# Patient Record
Sex: Female | Born: 1985 | Race: Black or African American | Hispanic: No | Marital: Single | Smoking: Former smoker
Health system: Southern US, Community
[De-identification: ages and names within clinical notes are randomized; demographics above are authoritative.]

## PROBLEM LIST (undated history)

## (undated) DIAGNOSIS — E559 Vitamin D deficiency, unspecified: Secondary | ICD-10-CM

## (undated) HISTORY — PX: LEG SURGERY: SHX1003

---
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
## 2007-09-28 ENCOUNTER — Ambulatory Visit (HOSPITAL_COMMUNITY): Admission: RE | Admit: 2007-09-28 | Discharge: 2007-09-28 | Payer: Self-pay | Admitting: Obstetrics & Gynecology

## 2008-01-09 ENCOUNTER — Ambulatory Visit (HOSPITAL_COMMUNITY): Admission: RE | Admit: 2008-01-09 | Discharge: 2008-01-09 | Payer: Self-pay | Admitting: Obstetrics & Gynecology

## 2008-02-21 ENCOUNTER — Inpatient Hospital Stay (HOSPITAL_COMMUNITY): Admission: AD | Admit: 2008-02-21 | Discharge: 2008-02-23 | Payer: Self-pay | Admitting: Obstetrics

## 2009-11-01 IMAGING — US US OB DETAIL+14 WK
1 of 2 series · 14 of 28 positions shown · non-contrast
Comparison: none

OBSTETRICAL ULTRASOUND:
 This ultrasound exam was performed in the [HOSPITAL] Ultrasound Department.  The OB US report was generated in the AS system, and faxed to the ordering physician.  This report is also available in [REDACTED] PACS.

[Series 1: us ob detail +14 wk · 14 of 96 slices shown]
[im 1/96]
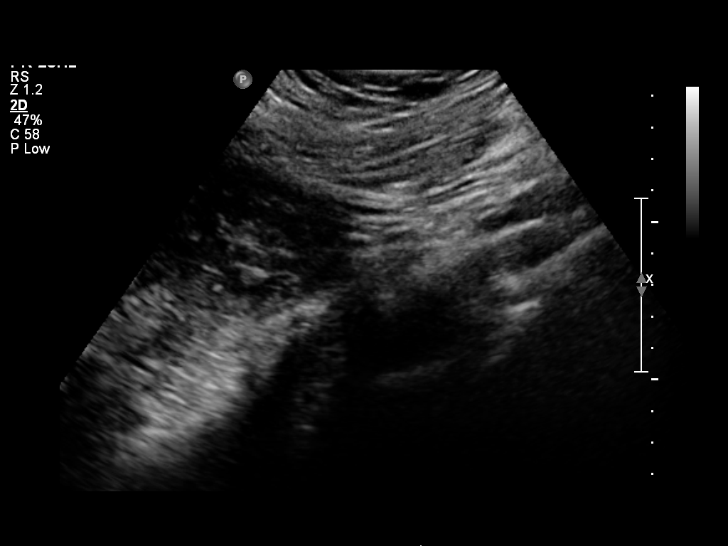
[im 8/96]
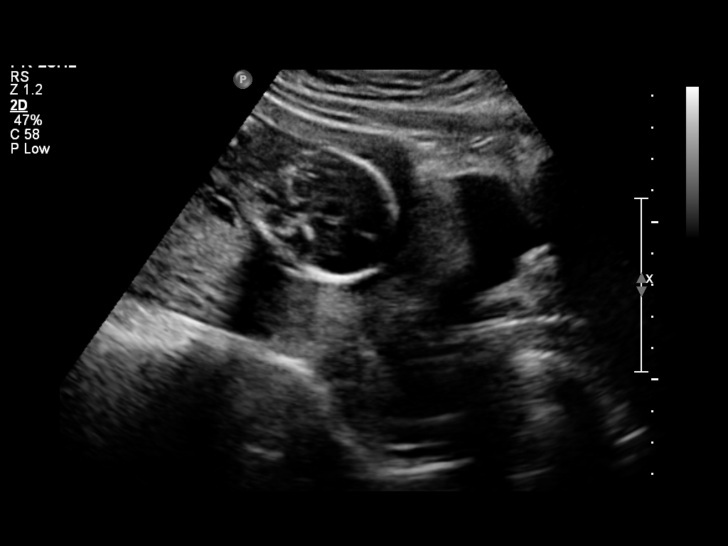
[im 15/96]
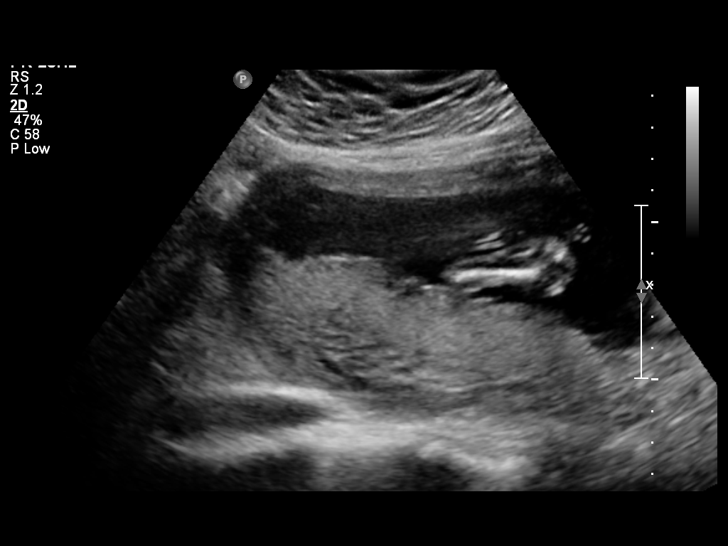
[im 22/96]
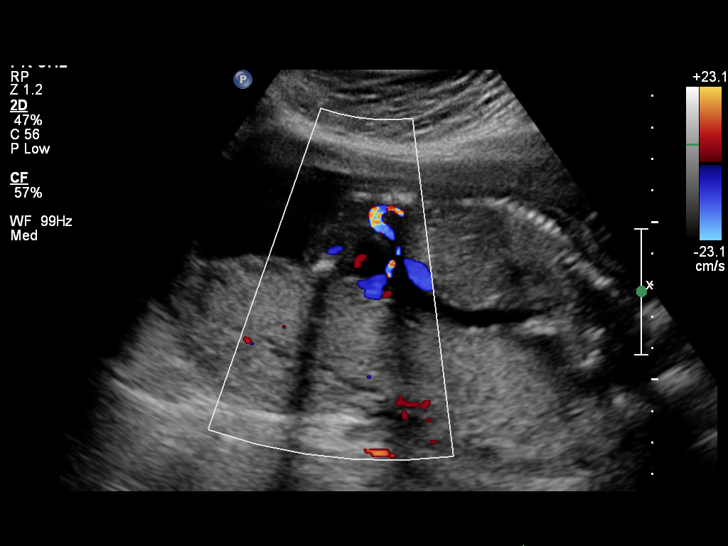
[im 30/96]
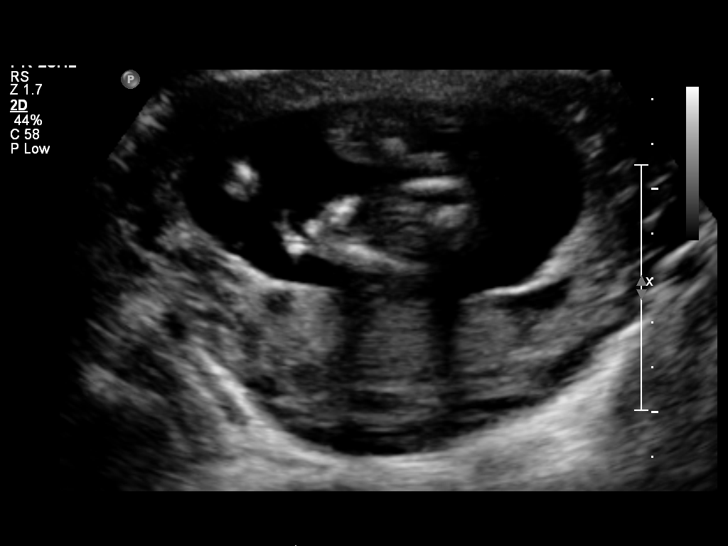
[im 37/96]
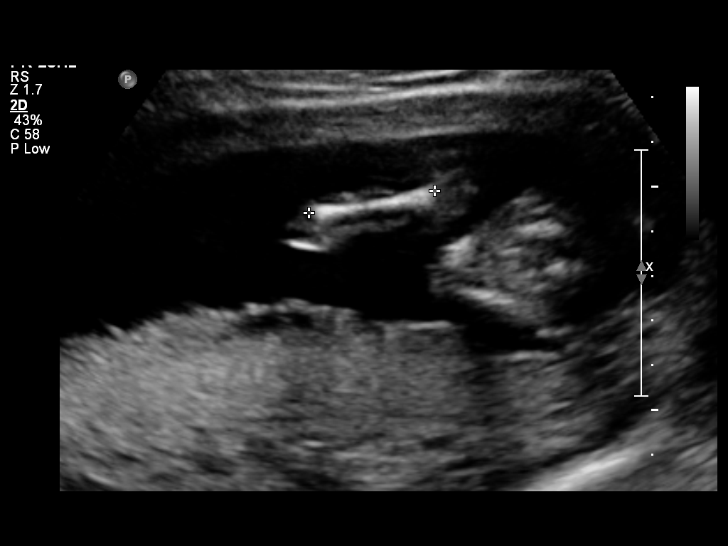
[im 44/96]
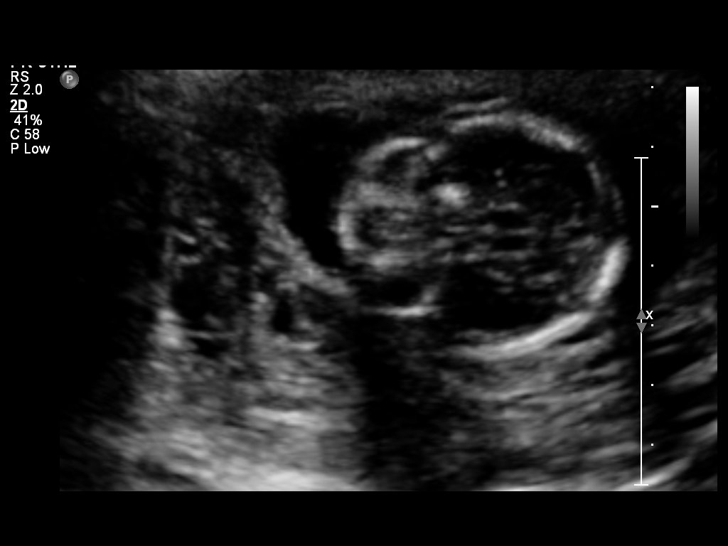
[im 52/96]
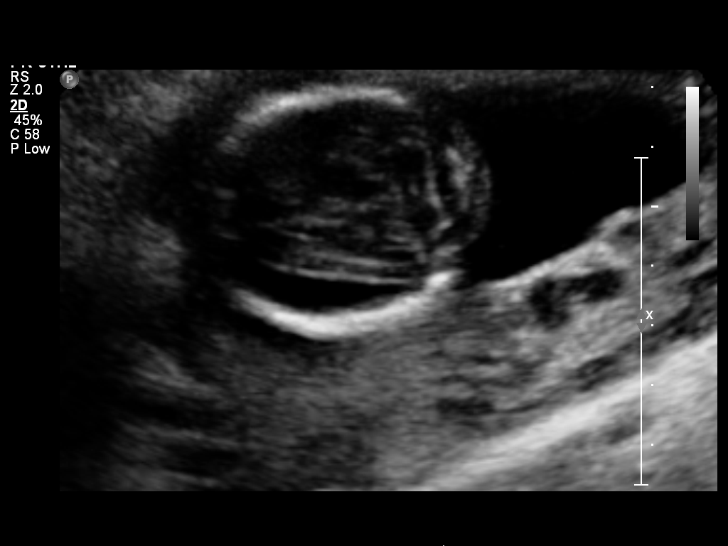
[im 59/96]
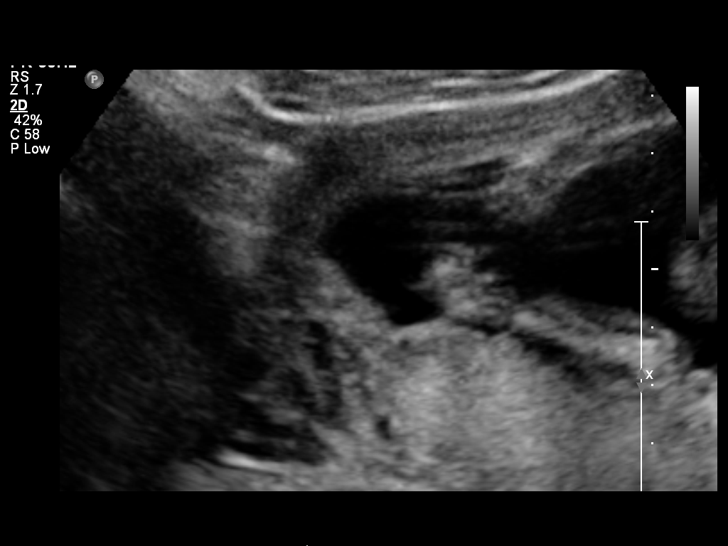
[im 66/96]
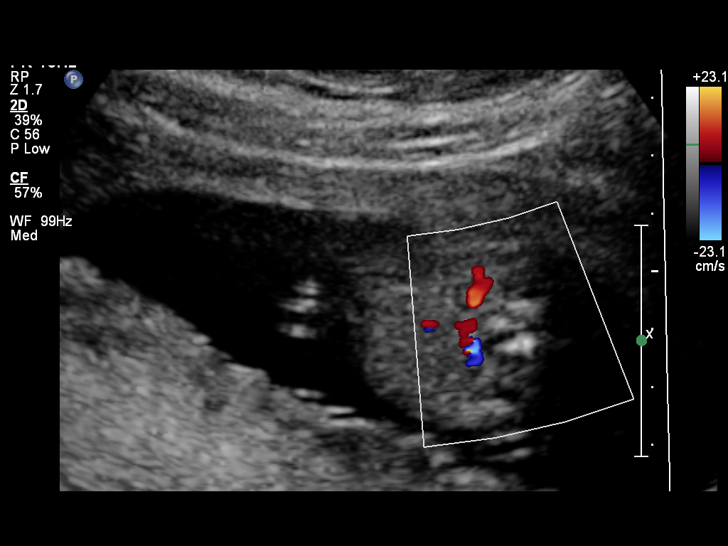
[im 74/96]
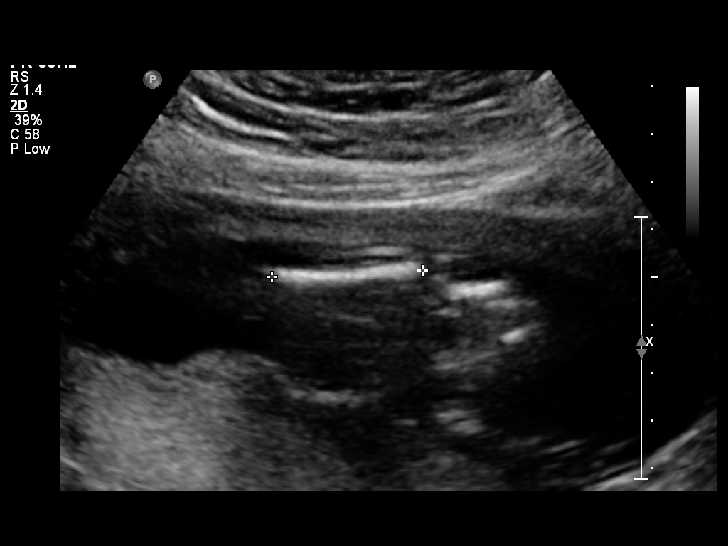
[im 81/96]
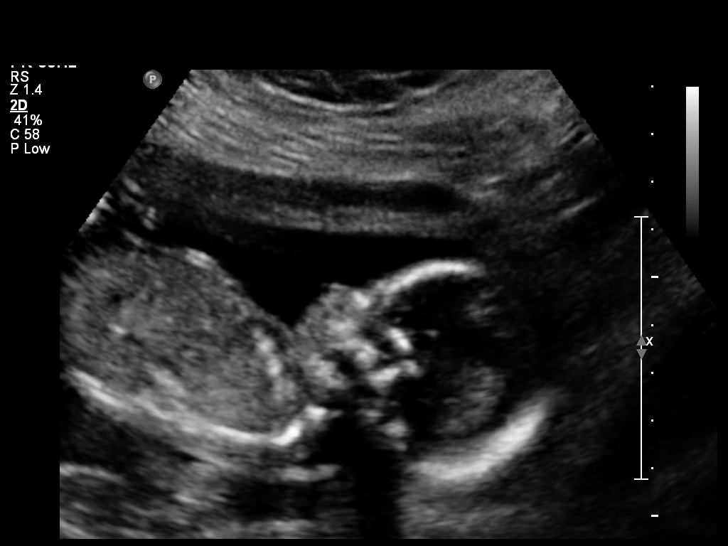
[im 88/96]
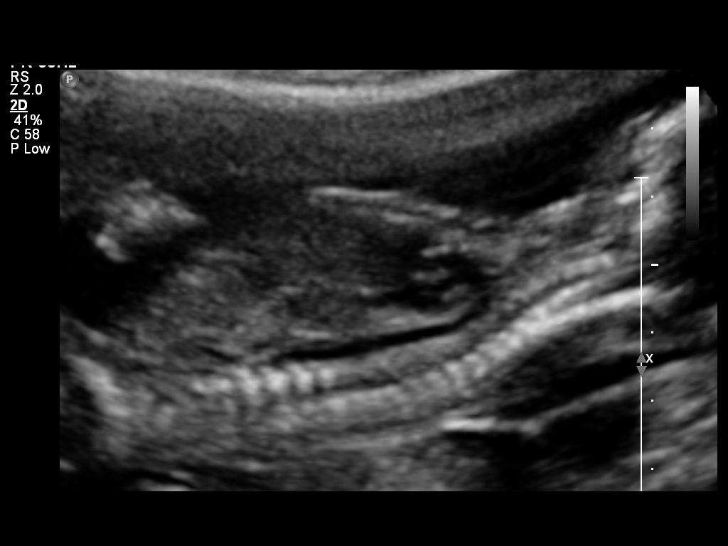
[im 96/96]
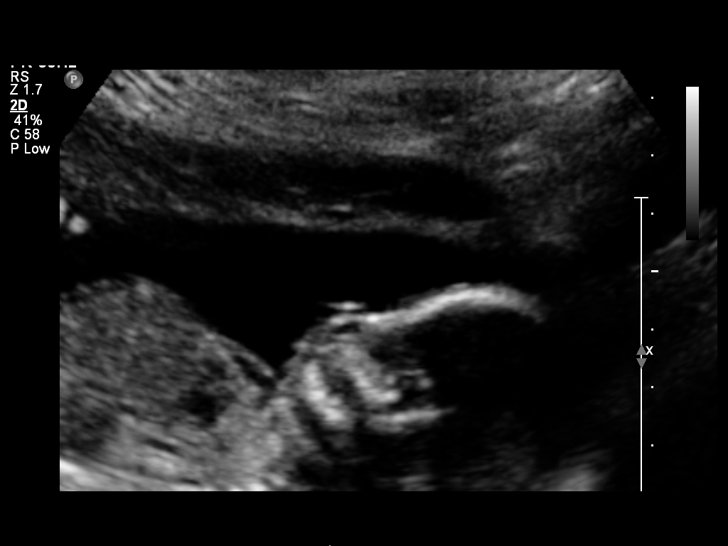

[14 of 28 positions shown; findings below may reference images not displayed]

IMPRESSION: See AS Obstetric US report.

## 2010-07-21 NOTE — H&P (Signed)
NAMEDECEMBER, [name]              ACCOUNT NO.:  [id number]   MEDICAL RECORD NO.:  [id number]          PATIENT TYPE:  INP   LOCATION:  9137                          FACILITY:  [facility]   PHYSICIAN:  Roseanna Rainbow, M.D.DATE OF BIRTH:  01/23/1986   DATE OF ADMISSION:  02/21/2008  DATE OF DISCHARGE:                              HISTORY & PHYSICAL   CHIEF COMPLAINT:  The patient is a 25 year old para 1 with an estimated  date of confinement of [name] 22, 2009, with an intrauterine pregnancy  at 39 weeks for an elective induction of labor.   HISTORY OF PRESENT ILLNESS:  Please see the above.   ALLERGIES:  No known drug allergies.   MEDICATIONS:  Prenatal vitamins.   PRENATAL LABS:  Blood type A positive, antibody screen negative, and  Chlamydia probe negative.  Urine culture and sensitivity in May 2009,  greater than 100,000 colonies/mL of E-coli.  1-hour GCT 85.  GC probe  negative.  Pap smear negative.  Affirm positive for Gardnerella  vaginalis.  Hepatitis B surface antigen negative.  Hematocrit 34.7,  hemoglobin 11.7, and platelets 262,000.  RPR nonreactive.  Rubella  immune.  Sickle cell negative.  Varicella immune.  GBS positive on  [name] 2.   PAST OBSTETRICAL HISTORY:  In April 2007 she has delivered of a live  born female at 41 weeks, 7 pounds 10 ounces vaginal delivery.  No  complications.   PAST GYN HISTORY:  Noncontributory.   PAST MEDICAL HISTORY:  No significant history of medical diseases.   PAST SURGICAL HISTORY:  She denies.   SOCIAL HISTORY:  She is a Consulting civil engineer.  She is also employed as a [employer] Services operational officer.  She is single living with her significant other.  She does not give any  significant history of alcohol usage.  She has no significant smoking  history.  She denies illicit drug use.   FAMILY HISTORY:  Remarkable for adult-onset diabetes and hypertension.   PHYSICAL EXAMINATION:  VITAL SIGNS:  Stable, afebrile.  Fetal heart tracing reassuring.   Tocodynamometer, regular uterine  contractions.  Sterile vaginal exam per the RN.   ASSESSMENT:  Primipara at term for an elective induction of labor.  Unfavorable Bishop score.  Fetal heart tracing consistent with fetal  well-being.  GBS positive.   PLAN:  Admission to stage induction of labor.  Penicillin GBS  prophylaxis.      Roseanna Rainbow, M.D.  Electronically Signed     LAJ/MEDQ  D:  02/22/2008  T:  02/22/2008  Job:  [id number]

## 2010-07-21 NOTE — H&P (Signed)
[name], [name]              ACCOUNT NO.:  [id number]   MEDICAL RECORD NO.:  [id number]          PATIENT TYPE:  INP   LOCATION:  9137                          [facility]ACILITY:  [facility]   PHYSICIAN:  Roseanna Rainbow, M.D.DATE OF BIRTH:  Jan 08, 1986   DATE OF ADMISSION:  02/21/2008  DATE OF DISCHARGE:                              HISTORY & PHYSICAL   CHIEF COMPLAINT:  The patient is a 25 year old, para 1, with an  estimated date of confinement of February 27, 2008 with an intrauterine  pregnancy at 39 weeks for an elective induction of labor.   HISTORY OF PRESENT ILLNESS:  Please see the above.   ALLERGIES:  No known drug allergies.   MEDICATIONS:  Please see the medication reconciliation form.   PRENATAL LABS:  Blood type A  positive.  Chlamydia probe negative.  GC  probe negative.  Urine culture and sensitivity in May 2009, greater than  100,000, colonies per mL E. Coli.      Roseanna Rainbow, M.D.  Electronically Signed     LAJ/MEDQ  D:  02/22/2008  T:  02/22/2008  Job:  [id number]

## 2010-07-24 NOTE — Discharge Summary (Signed)
NAMEPARISHA, [name]              ACCOUNT NO.:  [id number]   MEDICAL RECORD NO.:  [id number]          PATIENT TYPE:  INP   LOCATION:  9309                          [facility]ACILITY:  [facility]   PHYSICIAN:  Charles A. Clearance Coots, M.D.DATE OF BIRTH:  01-27-1986   DATE OF ADMISSION:  06/19/2005  DATE OF DISCHARGE:  06/23/2005                                 DISCHARGE SUMMARY   ADMITTING DIAGNOSIS:  Postpartum preeclampsia   DISCHARGE DIAGNOSIS:  Postpartum preeclampsia, improved after tocolysis and  bedrest.   Discharged home much improved   REASON FOR ADMISSION:  An 25 year old G1 P1 status post normal spontaneous  vaginal delivery on 06/16/2005 presented to urgent medical care with  complaint of ear ache.  Blood pressure on examination was 150/99.  The  patient was sent to [facility], [place], for increased blood  pressure.  She denied headache or visual changes.  Previous vaginal delivery  had been uncomplicated.   PAST MEDICAL HISTORY:  SURGERY: None.  ILLNESSES: None.   MEDICATIONS:  Antibiotic for ear infection, prenatal vitamins.   ALLERGIES:  NO KNOWN DRUG ALLERGIES.   SOCIAL HISTORY:  Single.  Negative tobacco, alcohol or recreational drug  use.   PHYSICAL EXAMINATION:  She is afebrile.  Blood pressure was 68/107.  LUNGS: Clear to auscultation bilaterally.  HEART: Regular rate and rhythm.  ABDOMEN: Gravid, nontender.  PELVIC: Exam omitted.   ADMITTING LABORATORY VALUES:  Hemoglobin 12.2, hematocrit 36, white blood  cell count 7600, platelets 227,000.  Comprehensive metabolic panel was  remarkable for a AST of 42.  Urinalysis remarkable for specific gravity  1.030, large hemoglobin, greater than 300 protein, few epithelials.   HOSPITAL COURSE:  The patient was admitted and started on IV magnesium  sulfate for seizure prophylaxis.  She continued to have elevated blood  pressures until hospital day #3 when her blood pressures normalized.   The remainder of the hospital  course was uncomplicated, and she was  discharged home on postpartum day #7 on labetalol and hydrochlorothiazide  p.o. in good condition.   DISCHARGE DISPOSITION:   MEDICATIONS:  1.  Labetalol 200 mg p.o. b.i.d.  2.  Hydrochlorothiazide 25 mg p.o. daily.  3.  Continue prenatal vitamins.   Routine written instructions were given for blood pressure precautions.  The  patient is to call office for a follow-up appointment in 48 hours for blood  pressure check.      Charles A. Clearance Coots, M.D.  Electronically Signed     CAH/MEDQ  D:  08/26/2005  T:  08/26/2005  Job:  [id number]

## 2010-07-27 ENCOUNTER — Emergency Department (HOSPITAL_COMMUNITY): Payer: BC Managed Care – PPO

## 2010-07-27 ENCOUNTER — Emergency Department (HOSPITAL_COMMUNITY)
Admission: EM | Admit: 2010-07-27 | Discharge: 2010-07-28 | Disposition: A | Discharge status: Home / Self Care | Payer: BC Managed Care – PPO | Attending: Emergency Medicine | Admitting: Emergency Medicine

## 2010-07-27 DIAGNOSIS — S82899A Other fracture of unspecified lower leg, initial encounter for closed fracture: Secondary | ICD-10-CM | POA: Insufficient documentation

## 2010-07-27 DIAGNOSIS — X500XXA Overexertion from strenuous movement or load, initial encounter: Secondary | ICD-10-CM | POA: Insufficient documentation

## 2010-07-27 DIAGNOSIS — Y93A3 Activity, aerobic and step exercise: Secondary | ICD-10-CM | POA: Insufficient documentation

## 2010-07-27 DIAGNOSIS — M25579 Pain in unspecified ankle and joints of unspecified foot: Secondary | ICD-10-CM | POA: Insufficient documentation

## 2010-08-05 ENCOUNTER — Emergency Department (HOSPITAL_BASED_OUTPATIENT_CLINIC_OR_DEPARTMENT_OTHER)
Admission: EM | Admit: 2010-08-05 | Discharge: 2010-08-05 | Disposition: A | Discharge status: Home / Self Care | Payer: BC Managed Care – PPO | Attending: Emergency Medicine | Admitting: Emergency Medicine

## 2010-08-05 DIAGNOSIS — K5900 Constipation, unspecified: Secondary | ICD-10-CM | POA: Insufficient documentation

## 2010-08-05 DIAGNOSIS — K5641 Fecal impaction: Secondary | ICD-10-CM | POA: Insufficient documentation

## 2010-12-11 LAB — CBC
HCT: 34.9 % — ABNORMAL LOW (ref 36.0–46.0)
HCT: 36.7 % (ref 36.0–46.0)
Hemoglobin: 11.4 g/dL — ABNORMAL LOW (ref 12.0–15.0)
Hemoglobin: 12.2 g/dL (ref 12.0–15.0)
MCHC: 32.7 g/dL (ref 30.0–36.0)
MCHC: 33.3 g/dL (ref 30.0–36.0)
MCV: 84.1 fL (ref 78.0–100.0)
MCV: 84.9 fL (ref 78.0–100.0)
Platelets: 216 10*3/uL (ref 150–400)
Platelets: 242 10*3/uL (ref 150–400)
RBC: 4.11 MIL/uL (ref 3.87–5.11)
RBC: 4.36 MIL/uL (ref 3.87–5.11)
RDW: 13.5 % (ref 11.5–15.5)
RDW: 14.1 % (ref 11.5–15.5)
WBC: 7.5 10*3/uL (ref 4.0–10.5)
WBC: 8.8 10*3/uL (ref 4.0–10.5)

## 2010-12-11 LAB — RPR: RPR Ser Ql: NONREACTIVE

## 2011-01-08 ENCOUNTER — Ambulatory Visit: Payer: BC Managed Care – PPO | Admitting: Family Medicine

## 2013-10-02 ENCOUNTER — Ambulatory Visit: Payer: Self-pay | Admitting: Advanced Practice Midwife

## 2013-10-31 ENCOUNTER — Ambulatory Visit: Payer: Self-pay | Admitting: Obstetrics & Gynecology

## 2014-03-04 ENCOUNTER — Encounter: Payer: Self-pay | Admitting: *Deleted

## 2014-03-05 ENCOUNTER — Encounter: Payer: Self-pay | Admitting: Obstetrics & Gynecology

## 2014-05-30 ENCOUNTER — Telehealth: Payer: Self-pay | Admitting: *Deleted

## 2014-05-30 NOTE — Telephone Encounter (Signed)
Patient is interested in an appointment for birth control.  Attempted to contact the patient and left message for patient to contact the office.

## 2014-06-21 NOTE — Telephone Encounter (Signed)
Patient returned call to the office. Patient currently has a Mirena IUD that is due to be removed. Patient would like to have a new one in it's place. Patient has been scheduled for 06/25/14 @ 10 am.

## 2014-06-25 ENCOUNTER — Ambulatory Visit (INDEPENDENT_AMBULATORY_CARE_PROVIDER_SITE_OTHER): Payer: BLUE CROSS/BLUE SHIELD | Admitting: Certified Nurse Midwife

## 2014-06-25 ENCOUNTER — Encounter: Payer: Self-pay | Admitting: Certified Nurse Midwife

## 2014-06-25 VITALS — BP 118/75 | HR 68 | Temp 98.5°F | Wt 252.0 lb

## 2014-06-25 DIAGNOSIS — Z01812 Encounter for preprocedural laboratory examination: Secondary | ICD-10-CM | POA: Diagnosis not present

## 2014-06-25 DIAGNOSIS — Z30430 Encounter for insertion of intrauterine contraceptive device: Secondary | ICD-10-CM | POA: Diagnosis not present

## 2014-06-25 LAB — POCT URINE PREGNANCY: Preg Test, Ur: NEGATIVE

## 2014-06-25 NOTE — Progress Notes (Signed)
Patient ID: [name], female   DOB: 11/07/1985, 29 y.o.   MRN: [id number]  IUD Procedure Note   DIAGNOSIS: Desires to continue long-term, reversible contraception   PROCEDURE: IUD placement Performing Provider: Orvilla Cornwallachelle Chelsey Redondo CNM  Patient counseled prior to procedure. I explained risks and benefits of Mirena IUD, reviewed alternative forms of contraception. Patient stated understanding and consented to continue with procedure.   LMP: unknown, not having regular cycles with Mirena IUD Pregnancy Test: Negative Lot #: [id number] Expiration Date: 11/18   IUD type: [X]  Mirena   PROCEDURE:  Timeout procedure was performed to ensure right patient and right site.  A bimanual exam was performed to determine the position of the uterus, midline, IUD strings present.The speculum was placed.  IUD removed intact without difficulty. The vagina and cervix was sterilized in the usual manner and sterile technique was maintained throughout the course of the procedure. A single toothed tenaculum was not needed. The depth of the uterus was sounded to 6.5cm. The IUD was inserted to the appropriate depth and inserted without difficulty.  The string was cut to an estimated 4 cm length. Bleeding was minimal. The patient tolerated the procedure well.   Follow up: The patient tolerated the procedure well without complications.  Standard post-procedure care is explained and return precautions are given.  F/U in 1 month for string check.  Patient educated to take Ibuprofen for pain.   Orvilla Cornwallachelle Lauriana Denes CNM

## 2014-06-28 LAB — SURESWAB, VAGINOSIS/VAGINITIS PLUS
Atopobium vaginae: NOT DETECTED Log (cells/mL)
C. GLABRATA, DNA: NOT DETECTED
C. albicans, DNA: NOT DETECTED
C. parapsilosis, DNA: NOT DETECTED
C. trachomatis RNA, TMA: NOT DETECTED
C. tropicalis, DNA: NOT DETECTED
Gardnerella vaginalis: NOT DETECTED Log (cells/mL)
LACTOBACILLUS SPECIES: NOT DETECTED Log (cells/mL)
MEGASPHAERA SPECIES: NOT DETECTED Log (cells/mL)
N. gonorrhoeae RNA, TMA: NOT DETECTED
T. vaginalis RNA, QL TMA: NOT DETECTED

## 2014-07-25 ENCOUNTER — Encounter: Payer: Self-pay | Admitting: Certified Nurse Midwife

## 2014-07-25 ENCOUNTER — Ambulatory Visit (INDEPENDENT_AMBULATORY_CARE_PROVIDER_SITE_OTHER): Payer: BLUE CROSS/BLUE SHIELD | Admitting: Certified Nurse Midwife

## 2014-07-25 ENCOUNTER — Ambulatory Visit: Payer: BLUE CROSS/BLUE SHIELD | Admitting: Certified Nurse Midwife

## 2014-07-25 VITALS — BP 128/88 | Temp 98.5°F | Ht 68.0 in | Wt 257.0 lb

## 2014-07-25 DIAGNOSIS — E669 Obesity, unspecified: Secondary | ICD-10-CM | POA: Diagnosis not present

## 2014-07-25 DIAGNOSIS — Z124 Encounter for screening for malignant neoplasm of cervix: Secondary | ICD-10-CM | POA: Diagnosis not present

## 2014-07-25 DIAGNOSIS — Z716 Tobacco abuse counseling: Secondary | ICD-10-CM | POA: Diagnosis not present

## 2014-07-25 DIAGNOSIS — Z720 Tobacco use: Secondary | ICD-10-CM

## 2014-07-25 DIAGNOSIS — Z01419 Encounter for gynecological examination (general) (routine) without abnormal findings: Secondary | ICD-10-CM | POA: Diagnosis not present

## 2014-07-25 LAB — CBC WITH DIFFERENTIAL/PLATELET
BASOS ABS: 0 10*3/uL (ref 0.0–0.1)
BASOS PCT: 0 % (ref 0–1)
EOS PCT: 1 % (ref 0–5)
Eosinophils Absolute: 0.1 10*3/uL (ref 0.0–0.7)
HCT: 41.5 % (ref 36.0–46.0)
Hemoglobin: 13.3 g/dL (ref 12.0–15.0)
Lymphocytes Relative: 37 % (ref 12–46)
Lymphs Abs: 2.4 10*3/uL (ref 0.7–4.0)
MCH: 26.6 pg (ref 26.0–34.0)
MCHC: 32 g/dL (ref 30.0–36.0)
MCV: 83 fL (ref 78.0–100.0)
MPV: 9.1 fL (ref 8.6–12.4)
Monocytes Absolute: 0.4 10*3/uL (ref 0.1–1.0)
Monocytes Relative: 6 % (ref 3–12)
NEUTROS ABS: 3.6 10*3/uL (ref 1.7–7.7)
Neutrophils Relative %: 56 % (ref 43–77)
PLATELETS: 283 10*3/uL (ref 150–400)
RBC: 5 MIL/uL (ref 3.87–5.11)
RDW: 13.6 % (ref 11.5–15.5)
WBC: 6.4 10*3/uL (ref 4.0–10.5)

## 2014-07-25 LAB — COMPREHENSIVE METABOLIC PANEL
ALBUMIN: 3.9 g/dL (ref 3.5–5.2)
ALT: 13 U/L (ref 0–35)
AST: 17 U/L (ref 0–37)
Alkaline Phosphatase: 56 U/L (ref 39–117)
BUN: 13 mg/dL (ref 6–23)
CO2: 26 mEq/L (ref 19–32)
Calcium: 9 mg/dL (ref 8.4–10.5)
Chloride: 105 mEq/L (ref 96–112)
Creat: 0.72 mg/dL (ref 0.50–1.10)
Glucose, Bld: 89 mg/dL (ref 70–99)
Potassium: 3.8 mEq/L (ref 3.5–5.3)
Sodium: 137 mEq/L (ref 135–145)
Total Bilirubin: 0.2 mg/dL (ref 0.2–1.2)
Total Protein: 7 g/dL (ref 6.0–8.3)

## 2014-07-25 LAB — CHOLESTEROL, TOTAL: CHOLESTEROL: 144 mg/dL (ref 0–200)

## 2014-07-25 LAB — HDL CHOLESTEROL: HDL: 41 mg/dL — ABNORMAL LOW (ref >=46)

## 2014-07-25 LAB — TSH: TSH: 0.742 u[IU]/mL (ref 0.350–4.500)

## 2014-07-25 LAB — TRIGLYCERIDES: Triglycerides: 58 mg/dL (ref <150)

## 2014-07-25 MED ORDER — VARENICLINE TARTRATE 0.5 MG X 11 & 1 MG X 42 PO MISC: ORAL | Status: DC

## 2014-07-25 NOTE — Progress Notes (Signed)
Patient ID: [name], female   DOB: 06/08/1985, 29 y.o.   MRN: [id number]    Subjective:     [name] is a 29 y.o. female here for a routine exam.  Current complaints: wants to quit smoking and loose weight.  Smokes 2 cigarettes/day. Under a lot of stress at work.      Personal health questionnaire:  Is patient Ashkenazi Jewish, have a family history of breast and/or ovarian cancer: no Is there a family history of uterine cancer diagnosed at age < 2650, gastrointestinal cancer, urinary tract cancer, family member who is a Personnel officerLynch syndrome-associated carrier: no Is the patient overweight and hypertensive, family history of diabetes, personal history of gestational diabetes, preeclampsia or PCOS: yes Is patient over 3955, have PCOS,  family history of premature CHD under age 28, diabetes, smoke, have hypertension or peripheral artery disease:  no At any time, has a partner hit, kicked or otherwise hurt or frightened you?: no Over the past 2 weeks, have you felt down, depressed or hopeless?: no Over the past 2 weeks, have you felt little interest or pleasure in doing things?:no   Gynecologic History No LMP recorded. Patient is not currently having periods (Reason: IUD). Contraception: IUD Last Pap: unknown. Results were: normal according to the patient Last mammogram: N/A.   Obstetric History OB History  No data available    History reviewed. No pertinent past medical history.  Past Surgical History  Procedure Laterality Date  . Leg surgery      left ankle     Current outpatient prescriptions:  .  varenicline (CHANTIX STARTING MONTH PAK) 0.5 MG X 11 & 1 MG X 42 tablet, Take one 0.5 mg tablet by mouth once daily for 3 days, then increase to one 0.5 mg tablet twice daily for 4 days, then increase to one 1 mg tablet twice daily., Disp: 53 tablet, Rfl: 0 Not on File  History  Substance Use Topics  . Smoking status: Current Some Day Smoker  . Smokeless tobacco: Not on file   . Alcohol Use: 0.0 oz/week    0 Standard drinks or equivalent per week     Comment: occasional    Family History  Problem Relation Age of Onset  . Hypertension Mother   . Hyperlipidemia Mother   . Hypertension Father   . Hyperlipidemia Father   . Diabetes Paternal Grandmother       Review of Systems  Constitutional: negative for fatigue and weight loss Respiratory: negative for cough and wheezing Cardiovascular: negative for chest pain, fatigue and palpitations Gastrointestinal: negative for abdominal pain and change in bowel habits Musculoskeletal:negative for myalgias Neurological: negative for gait problems and tremors Behavioral/Psych: negative for abusive relationship, depression Endocrine: negative for temperature intolerance   Genitourinary:negative for abnormal menstrual periods, genital lesions, hot flashes, sexual problems and vaginal discharge Integument/breast: negative for breast lump, breast tenderness, nipple discharge and skin lesion(s)    Objective:       BP 128/88 mmHg  Temp(Src) 98.5 F (36.9 C)  Ht 5\' 8"  (1.727 m)  Wt 116.574 kg (257 lb)  BMI 39.09 kg/m2 General:   alert  Skin:   no rash or abnormalities  Lungs:   clear to auscultation bilaterally  Heart:   regular rate and rhythm, S1, S2 normal, no murmur, click, rub or gallop  Breasts:   normal without suspicious masses, skin or nipple changes or axillary nodes  Abdomen:  normal findings: no organomegaly, soft, non-tender and no hernia  Pelvis:  External genitalia: normal general appearance Urinary system: urethral meatus normal and bladder without fullness, nontender Vaginal: normal without tenderness, induration or masses Cervix: normal appearance, strings visualized Adnexa: normal bimanual exam Uterus: anteverted and non-tender, normal size   Lab Review Urine pregnancy test Labs reviewed yes Radiologic studies reviewed no  50% of 30 min visit spent on counseling and coordination of  care.   Assessment:    Healthy female exam.   Obesity IUD strings present Smoking cessation   Plan:    Education reviewed: depression evaluation, low fat, low cholesterol diet, safe sex/STD prevention, self breast exams, skin cancer screening, smoking cessation and weight bearing exercise. Contraception: IUD. Follow up in: 1 year.   Meds ordered this encounter  Medications  . varenicline (CHANTIX STARTING MONTH PAK) 0.5 MG X 11 & 1 MG X 42 tablet    Sig: Take one 0.5 mg tablet by mouth once daily for 3 days, then increase to one 0.5 mg tablet twice daily for 4 days, then increase to one 1 mg tablet twice daily.    Dispense:  53 tablet    Refill:  0   Orders Placed This Encounter  Procedures  . SureSwab, Vaginosis/Vaginitis Plus  . HIV antibody (with reflex)  . CBC with Differential/Platelet  . Comprehensive metabolic panel  . TSH  . Cholesterol, total  . Triglycerides  . HDL cholesterol  . Referral to Nutrition and Diabetes Services    Referral Priority:  Routine    Referral Type:  Consultation    Referral Reason:  Specialty Services Required    Number of Visits Requested:  1   Need to obtain previous records Possible management options include: Wellbutrin for smoking cessation

## 2014-07-26 LAB — HIV ANTIBODY (ROUTINE TESTING W REFLEX): HIV 1&2 Ab, 4th Generation: NONREACTIVE

## 2014-07-26 LAB — PAP IG W/ RFLX HPV ASCU

## 2014-07-29 LAB — SURESWAB, VAGINOSIS/VAGINITIS PLUS
Atopobium vaginae: NOT DETECTED Log (cells/mL)
C. albicans, DNA: NOT DETECTED
C. glabrata, DNA: NOT DETECTED
C. parapsilosis, DNA: NOT DETECTED
C. trachomatis RNA, TMA: NOT DETECTED
C. tropicalis, DNA: NOT DETECTED
Gardnerella vaginalis: NOT DETECTED Log (cells/mL)
LACTOBACILLUS SPECIES: NOT DETECTED Log (cells/mL)
MEGASPHAERA SPECIES: NOT DETECTED Log (cells/mL)
N. gonorrhoeae RNA, TMA: NOT DETECTED
T. VAGINALIS RNA, QL TMA: NOT DETECTED

## 2016-02-24 ENCOUNTER — Encounter: Payer: Self-pay | Admitting: Emergency Medicine

## 2017-04-27 ENCOUNTER — Encounter: Payer: Self-pay | Admitting: Certified Nurse Midwife

## 2017-04-27 ENCOUNTER — Other Ambulatory Visit (HOSPITAL_COMMUNITY)
Admission: RE | Admit: 2017-04-27 | Discharge: 2017-04-27 | Disposition: A | Discharge status: Home / Self Care | Payer: Medicaid Other | Source: Ambulatory Visit | Attending: Certified Nurse Midwife | Admitting: Certified Nurse Midwife

## 2017-04-27 ENCOUNTER — Ambulatory Visit (INDEPENDENT_AMBULATORY_CARE_PROVIDER_SITE_OTHER): Payer: Medicaid Other | Admitting: Certified Nurse Midwife

## 2017-04-27 VITALS — BP 119/80 | HR 61 | Ht 68.0 in | Wt 261.0 lb

## 2017-04-27 DIAGNOSIS — Z01419 Encounter for gynecological examination (general) (routine) without abnormal findings: Secondary | ICD-10-CM

## 2017-04-27 DIAGNOSIS — Z23 Encounter for immunization: Secondary | ICD-10-CM

## 2017-04-27 DIAGNOSIS — Z716 Tobacco abuse counseling: Secondary | ICD-10-CM

## 2017-04-27 DIAGNOSIS — N898 Other specified noninflammatory disorders of vagina: Secondary | ICD-10-CM

## 2017-04-27 DIAGNOSIS — Z0000 Encounter for general adult medical examination without abnormal findings: Secondary | ICD-10-CM | POA: Diagnosis not present

## 2017-04-27 MED ORDER — BUPROPION HCL ER (XL) 150 MG PO TB24: 150.0000 mg | ORAL_TABLET | Freq: Every day | ORAL | 12 refills | Status: DC

## 2017-04-27 NOTE — Progress Notes (Signed)
Pt is in the office for annual, last pap 06-25-2014. Pt currently has IUD in place, declines std testing. 1st dose of Gardasil administered and pt tolerated well.

## 2017-04-27 NOTE — Progress Notes (Signed)
Subjective:        [name] is a 32 y.o. female here for a routine exam.  Current complaints: vaginal discharge, regular 2 day periods with IUD.  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  Desires to start on Guardasil series.    Not currently sexually active.  Is smoking 1/2-1 pack a day.  Desires to quit smoking and has not tried anything yet.  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    Mirena IUD placed 06/25/14  Personal health questionnaire:  Is patient Ashkenazi Jewish, have a family history of breast and/or ovarian cancer: no Is there a family history of uterine cancer diagnosed at age < 36, gastrointestinal cancer, urinary tract cancer, family member who is a Personnel officer syndrome-associated carrier: no Is the patient overweight and hypertensive, family history of diabetes, personal history of gestational diabetes, preeclampsia or PCOS: yes Is patient over 48, have PCOS,  family history of premature CHD under age 3, diabetes, smoke, have hypertension or peripheral artery disease:  no At any time, has a partner hit, kicked or otherwise hurt or frightened you?: no Over the past 2 weeks, have you felt down, depressed or hopeless?: no Over the past 2 weeks, have you felt little interest or pleasure in doing things?:no  Gynecologic History No LMP recorded. Patient is not currently having periods (Reason: IUD). Contraception: IUD Last Pap: unknown, 06/2014 in our records. Results were: normal Last mammogram: n/a <40 years, no significant family hx.   Obstetric History OB History  No data available    History reviewed. No pertinent past medical history.  Past Surgical History:  Procedure Laterality Date  . LEG SURGERY     left ankle     Current Outpatient Medications:  .  buPROPion (WELLBUTRIN XL) 150 MG 24 hr tablet, Take 1 tablet (150 mg total) by mouth daily., Disp: 30 tablet, Rfl: 12 .  varenicline (CHANTIX STARTING MONTH PAK) 0.5 MG  X 11 & 1 MG X 42 tablet, Take one 0.5 mg tablet by mouth once daily for 3 days, then increase to one 0.5 mg tablet twice daily for 4 days, then increase to one 1 mg tablet twice daily. (Patient not taking: Reported on 04/27/2017), Disp: 53 tablet, Rfl: 0 No Known Allergies  Social History   Tobacco Use  . Smoking status: Current Every Day Smoker  . Smokeless tobacco: Never Used  Substance Use Topics  . Alcohol use: Yes    Alcohol/week: 0.0 oz    Comment: occasional    Family History  Problem Relation Age of Onset  . Hypertension Mother   . Hyperlipidemia Mother   . Hypertension Father   . Hyperlipidemia Father   . Diabetes Paternal Grandmother       Review of Systems  Constitutional: negative for fatigue and weight loss Respiratory: negative for cough and wheezing Cardiovascular: negative for chest pain, fatigue and palpitations Gastrointestinal: negative for abdominal pain and change in bowel habits Musculoskeletal:negative for myalgias Neurological: negative for gait problems and tremors Behavioral/Psych: negative for abusive relationship, depression Endocrine: negative for temperature intolerance    Genitourinary:negative for abnormal menstrual periods, genital lesions, hot flashes, sexual problems and vaginal discharge Integument/breast: negative for breast lump, breast tenderness, nipple discharge and skin lesion(s)    Objective:       BP 119/80   Pulse 61   Ht 5\' 8"  (1.727 m)   Wt 261 lb (118.4 kg)  BMI 39.68 kg/m  General:   alert  Skin:   no rash or abnormalities  Lungs:   clear to auscultation bilaterally  Heart:   regular rate and rhythm, S1, S2 normal, no murmur, click, rub or gallop  Breasts:   normal without suspicious masses, skin or nipple changes or axillary nodes  Abdomen:  normal findings: no organomegaly, soft, non-tender and no hernia Body habitus noted  Pelvis:  External genitalia: normal general appearance Urinary system: urethral meatus  normal and bladder without fullness, nontender Vaginal: normal without tenderness, induration or masses Cervix: normal appearance, IUD strings present Adnexa: normal bimanual exam Uterus: anteverted and non-tender, normal size   Lab Review Urine pregnancy test Labs reviewed yes Radiologic studies reviewed no  50% of 45 min visit spent on counseling and coordination of care.   Assessment & Plan    Healthy female exam.    1. Well woman exam     Starting on HPV vaciene series.  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 - Cytology - PAP  2. Vaginal discharge    - Cervicovaginal ancillary only  3. Encounter for smoking cessation counseling     [place] Quit line number given.  - buPROPion (WELLBUTRIN XL) 150 MG 24 hr tablet; Take 1 tablet (150 mg total) by mouth daily.  Dispense: 30 tablet; Refill: 12   Education reviewed: calcium supplements, depression evaluation, low fat, low cholesterol diet, safe sex/STD prevention, self breast exams, skin cancer screening, smoking cessation and weight bearing exercise. Contraception: IUD. Follow up in: 1 year. for annual exam.  2 months for 2nd Guardasil.   Meds ordered this encounter  Medications  . buPROPion (WELLBUTRIN XL) 150 MG 24 hr tablet    Sig: Take 1 tablet (150 mg total) by mouth daily.    Dispense:  30 tablet    Refill:  12    Need to obtain previous records Possible management options include: Chantix Follow up as needed.

## 2017-04-28 LAB — CERVICOVAGINAL ANCILLARY ONLY
Bacterial vaginitis: NEGATIVE
Candida vaginitis: NEGATIVE

## 2017-05-03 LAB — CYTOLOGY - PAP
Diagnosis: NEGATIVE
HPV: NOT DETECTED

## 2017-05-05 ENCOUNTER — Other Ambulatory Visit: Payer: Self-pay | Admitting: Certified Nurse Midwife

## 2017-05-25 ENCOUNTER — Telehealth: Payer: Self-pay

## 2017-05-25 NOTE — Telephone Encounter (Signed)
Pt notified to not continue  Pt added that she occasional drank energy drinks while taking the Rx unsure if that may a factor in tremors.  Pt agrees to starting a new Rx, however she also noted if she could keep a journal of taking the medication (Wellbutrin) during the course of a week w/o drinking any energy drinks and see if she has the tremors.

## 2017-05-25 NOTE — Telephone Encounter (Signed)
TC to pt regarding message. Pt c/o hand tremors after starting Wellbutrin Rx Pt stopped Rx 2 days ago  No shaking since  Pt unsure if she should continue medication or try something different  Please advise.

## 2017-05-25 NOTE — Telephone Encounter (Signed)
Not continue.  We can try another medication.  Annaleigha Woo

## 2017-06-27 ENCOUNTER — Ambulatory Visit: Payer: Medicaid Other

## 2017-06-30 ENCOUNTER — Ambulatory Visit: Payer: Medicaid Other

## 2017-06-30 ENCOUNTER — Ambulatory Visit (INDEPENDENT_AMBULATORY_CARE_PROVIDER_SITE_OTHER): Payer: Medicaid Other

## 2017-06-30 DIAGNOSIS — Z23 Encounter for immunization: Secondary | ICD-10-CM | POA: Diagnosis not present

## 2017-06-30 NOTE — Progress Notes (Signed)
Pt here for her second gardasil inj. Inj given in left deltoid. Pt tolerated well. Pt advised to schedule appt for her last inj in 4 months.

## 2017-07-01 NOTE — Progress Notes (Signed)
I have reviewed this chart and agree with the RN/CMA assessment and management.    K. Meryl Tennis Mckinnon, M.D. Attending Obstetrician & Gynecologist, [facility], [facility]  

## 2017-10-31 ENCOUNTER — Ambulatory Visit: Payer: Medicaid Other

## 2018-04-26 ENCOUNTER — Emergency Department (HOSPITAL_COMMUNITY)
Admission: EM | Admit: 2018-04-26 | Discharge: 2018-04-27 | Disposition: A | Discharge status: Home / Self Care | Payer: Medicaid Other | Attending: Emergency Medicine | Admitting: Emergency Medicine

## 2018-04-26 ENCOUNTER — Encounter (HOSPITAL_COMMUNITY): Payer: Self-pay | Admitting: Emergency Medicine

## 2018-04-26 DIAGNOSIS — S01512A Laceration without foreign body of oral cavity, initial encounter: Secondary | ICD-10-CM | POA: Diagnosis not present

## 2018-04-26 DIAGNOSIS — Y999 Unspecified external cause status: Secondary | ICD-10-CM | POA: Diagnosis not present

## 2018-04-26 DIAGNOSIS — F17200 Nicotine dependence, unspecified, uncomplicated: Secondary | ICD-10-CM | POA: Diagnosis not present

## 2018-04-26 DIAGNOSIS — S0993XA Unspecified injury of face, initial encounter: Secondary | ICD-10-CM | POA: Diagnosis present

## 2018-04-26 DIAGNOSIS — Y9301 Activity, walking, marching and hiking: Secondary | ICD-10-CM | POA: Diagnosis not present

## 2018-04-26 DIAGNOSIS — W228XXA Striking against or struck by other objects, initial encounter: Secondary | ICD-10-CM | POA: Diagnosis not present

## 2018-04-26 DIAGNOSIS — R55 Syncope and collapse: Secondary | ICD-10-CM

## 2018-04-26 DIAGNOSIS — Y92003 Bedroom of unspecified non-institutional (private) residence as the place of occurrence of the external cause: Secondary | ICD-10-CM | POA: Insufficient documentation

## 2018-04-26 LAB — BASIC METABOLIC PANEL
Anion gap: 6 (ref 5–15)
BUN: 12 mg/dL (ref 6–20)
CO2: 25 mmol/L (ref 22–32)
Calcium: 7.8 mg/dL — ABNORMAL LOW (ref 8.9–10.3)
Chloride: 111 mmol/L (ref 98–111)
Creatinine, Ser: 0.79 mg/dL (ref 0.44–1.00)
GFR calc Af Amer: >60 mL/min (ref >60)
GFR calc non Af Amer: >60 mL/min (ref >60)
Glucose, Bld: 94 mg/dL (ref 70–99)
Potassium: 4 mmol/L (ref 3.5–5.1)
Sodium: 142 mmol/L (ref 135–145)

## 2018-04-26 LAB — CBC
HCT: 45.3 % (ref 36.0–46.0)
Hemoglobin: 13.6 g/dL (ref 12.0–15.0)
MCH: 26.6 pg (ref 26.0–34.0)
MCHC: 30 g/dL (ref 30.0–36.0)
MCV: 88.5 fL (ref 80.0–100.0)
Platelets: 232 10*3/uL (ref 150–400)
RBC: 5.12 MIL/uL — ABNORMAL HIGH (ref 3.87–5.11)
RDW: 13.6 % (ref 11.5–15.5)
WBC: 7 10*3/uL (ref 4.0–10.5)
nRBC: 0 % (ref 0.0–0.2)

## 2018-04-26 LAB — I-STAT BETA HCG BLOOD, ED (MC, WL, AP ONLY): I-stat hCG, quantitative: <5 m[IU]/mL (ref <5)

## 2018-04-26 MED ORDER — SODIUM CHLORIDE 0.9 % IV BOLUS (SEPSIS)
1000.0000 mL | Freq: Once | INTRAVENOUS | Status: AC
  Administered 2018-04-26: 1000 mL via INTRAVENOUS

## 2018-04-26 NOTE — ED Notes (Signed)
Bed: WA01 Expected date:  Expected time:  Means of arrival:  Comments: 34 yr old syncopal episode

## 2018-04-26 NOTE — ED Notes (Signed)
Went in room to get vitals on patient and patient was not present. Nurse aware that patient still needs vitals.

## 2018-04-26 NOTE — ED Provider Notes (Signed)
[facility]-EMERGENCY DEPT Provider Note   CSN: [id number] Arrival date & time: 04/26/18  2252    History   Chief Complaint Chief Complaint - syncope  HPI [name] is a 33 y.o. female.     The history is provided by the patient.  Loss of Consciousness  Episode history:  Single Most recent episode:  Today Timing:  Constant Progression:  Resolved Chronicity:  New Relieved by:  None tried Worsened by:  Nothing Associated symptoms: dizziness   Associated symptoms: no chest pain, no fever, no focal weakness, no headaches, no shortness of breath and no vomiting   Risk factors: no coronary artery disease   Patient presents for syncopal episode. She reports she was walking in her bedroom she felt lightheaded and dizzy and she passed out. She reports she just flew back to town from [place] earlier today and was drinking less water.  When she went to class, then she went to donate plasma.  She is donated plasma twice in less than a week No chest pain/shortness of breath/headache. She has never had this before.     PMH-ADHD Patient Active Problem List   Diagnosis Date Noted  . Tobacco abuse 07/25/2014  . Obesity 07/25/2014    Past Surgical History:  Procedure Laterality Date  . LEG SURGERY     left ankle     OB History   No obstetric history on file.      Home Medications    Prior to Admission medications   Medication Sig Start Date End Date Taking? Authorizing Provider  buPROPion (WELLBUTRIN XL) 150 MG 24 hr tablet Take 1 tablet (150 mg total) by mouth daily. 04/27/17   Orvilla Cornwall A, CNM  varenicline (CHANTIX STARTING MONTH PAK) 0.5 MG X 11 & 1 MG X 42 tablet Take one 0.5 mg tablet by mouth once daily for 3 days, then increase to one 0.5 mg tablet twice daily for 4 days, then increase to one 1 mg tablet twice daily. Patient not taking: Reported on 04/27/2017 07/25/14   Roe Coombs, CNM    Family History Family History    Problem Relation Age of Onset  . Hypertension Mother   . Hyperlipidemia Mother   . Hypertension Father   . Hyperlipidemia Father   . Diabetes Paternal Grandmother     Social History Social History   Tobacco Use  . Smoking status: Current Every Day Smoker  . Smokeless tobacco: Never Used  Substance Use Topics  . Alcohol use: Yes    Alcohol/week: 0.0 standard drinks    Comment: occasional  . Drug use: No     Allergies   Patient has no known allergies.   Review of Systems Review of Systems  Constitutional: Negative for fever.  Respiratory: Negative for shortness of breath.   Cardiovascular: Positive for syncope. Negative for chest pain.  Gastrointestinal: Negative for vomiting.  Neurological: Positive for dizziness and syncope. Negative for focal weakness and headaches.  All other systems reviewed and are negative.    Physical Exam Updated Vital Signs BP 111/67 (BP Location: Right Arm)   Pulse (!) 55   Temp 98 F (36.7 C) (Oral)   Resp 18   LMP 04/03/2018   SpO2 100%   Physical Exam CONSTITUTIONAL: Well developed/well nourished HEAD: Normocephalic/atraumatic EYES: EOMI/PERRL ENMT: Mucous membranes moist, no dental injury, no nasal trauma.  Small laceration noted to the lower buccal mucosa without bleeding, not through and through NECK: supple no meningeal signs  SPINE/BACK:entire spine nontender CV: S1/S2 noted, no murmurs/rubs/gallops noted LUNGS: Lungs are clear to auscultation bilaterally, no apparent distress ABDOMEN: soft, nontender, no rebound or guarding, bowel sounds noted throughout abdomen GU:no cva tenderness NEURO: Pt is awake/alert/appropriate, moves all extremitiesx4.  No facial droop.  GCS 15 No arm or leg drift EXTREMITIES: pulses normal/equal, full ROM, no tenderness or deformities SKIN: warm, color normal PSYCH: no abnormalities of mood noted, alert and oriented to situation   ED Treatments / Results  Labs (all labs ordered are  listed, but only abnormal results are displayed) Labs Reviewed  CBC - Abnormal; Notable for the following components:      Result Value   RBC 5.12 (*)    All other components within normal limits  BASIC METABOLIC PANEL - Abnormal; Notable for the following components:   Calcium 7.8 (*)    All other components within normal limits  I-STAT BETA HCG BLOOD, ED ([facility], [facility], [facility] ONLY)    EKG EKG Interpretation  Date/Time:  Wednesday April 26 2018 23:49:16 EST Ventricular Rate:  56 PR Interval:    QRS Duration: 90 QT Interval:  441 QTC Calculation: 426 R Axis:   76 Text Interpretation:  Sinus rhythm Normal ECG No previous ECGs available Confirmed by Zadie Rhine ([id number]) on 04/26/2018 11:56:56 PM   Radiology No results found.  Procedures Procedures   Medications Ordered in ED Medications  sodium chloride 0.9 % bolus 1,000 mL (1,000 mLs Intravenous New Bag/Given 04/26/18 2343)     Initial Impression / Assessment and Plan / ED Course  I have reviewed the triage vital signs and the nursing notes.  Pertinent labs  results that were available during my care of the patient were reviewed by me and considered in my medical decision making (see chart for details).        11:40 PM Patient presents after syncopal episode.  Probably due to dehydration as patient has given plasma recently.  She is in no acute distress.  Will give IV fluids and reassess.  Initial blood pressure per EMS was 72/30 12:44 AM Patient improved. She is ambulatory.  No new symptoms.  She has responded to IV fluids.  Strong suspicion this is related to dehydration from plasma donation. She does have a small oral laceration that will heal without repair. Otherwise appropriate for discharge home.  Father arrived to confirm the story, no seizures were reported.  Final Clinical Impressions(s) / ED Diagnoses   Final diagnoses:  Syncope and collapse  Laceration of oral cavity, initial encounter    ED  Discharge Orders    None       Zadie Rhine, MD 04/27/18 [phone]

## 2018-04-26 NOTE — ED Triage Notes (Signed)
From home. Pt had syncopal episode while walking around in her bedroom. Pt has just started donating plasma and has done so 3x this week. Pt had a flight from [place] today and states she has not eaten or drank very much water today.   Vitals BP standing 72/30 BP supine 120/94 RR 18 SpO2 100% RA CBG 122 Patient received 1L of fluids from EMS

## 2018-08-30 ENCOUNTER — Ambulatory Visit: Payer: Self-pay | Admitting: *Deleted

## 2018-08-30 ENCOUNTER — Encounter (HOSPITAL_COMMUNITY): Payer: Self-pay

## 2018-08-30 ENCOUNTER — Other Ambulatory Visit: Payer: Self-pay

## 2018-08-30 ENCOUNTER — Emergency Department (HOSPITAL_COMMUNITY)
Admission: EM | Admit: 2018-08-30 | Discharge: 2018-08-30 | Disposition: A | Discharge status: Home / Self Care | Payer: Medicaid Other | Attending: Emergency Medicine | Admitting: Emergency Medicine

## 2018-08-30 ENCOUNTER — Emergency Department (HOSPITAL_COMMUNITY): Payer: Medicaid Other

## 2018-08-30 DIAGNOSIS — Z79899 Other long term (current) drug therapy: Secondary | ICD-10-CM | POA: Insufficient documentation

## 2018-08-30 DIAGNOSIS — R12 Heartburn: Secondary | ICD-10-CM | POA: Diagnosis not present

## 2018-08-30 DIAGNOSIS — F17210 Nicotine dependence, cigarettes, uncomplicated: Secondary | ICD-10-CM | POA: Insufficient documentation

## 2018-08-30 DIAGNOSIS — R079 Chest pain, unspecified: Secondary | ICD-10-CM | POA: Diagnosis present

## 2018-08-30 HISTORY — DX: Vitamin D deficiency, unspecified: E55.9

## 2018-08-30 LAB — BASIC METABOLIC PANEL
Anion gap: 5 (ref 5–15)
BUN: 7 mg/dL (ref 6–20)
CO2: 27 mmol/L (ref 22–32)
Calcium: 8.9 mg/dL (ref 8.9–10.3)
Chloride: 108 mmol/L (ref 98–111)
Creatinine, Ser: 0.64 mg/dL (ref 0.44–1.00)
GFR calc Af Amer: >60 mL/min (ref >60)
GFR calc non Af Amer: >60 mL/min (ref >60)
Glucose, Bld: 105 mg/dL — ABNORMAL HIGH (ref 70–99)
Potassium: 3.6 mmol/L (ref 3.5–5.1)
Sodium: 140 mmol/L (ref 135–145)

## 2018-08-30 LAB — I-STAT BETA HCG BLOOD, ED (NOT ORDERABLE): I-stat hCG, quantitative: <5 m[IU]/mL (ref <5)

## 2018-08-30 LAB — CBC
HCT: 40.8 % (ref 36.0–46.0)
Hemoglobin: 13 g/dL (ref 12.0–15.0)
MCH: 27.1 pg (ref 26.0–34.0)
MCHC: 31.9 g/dL (ref 30.0–36.0)
MCV: 85.2 fL (ref 80.0–100.0)
Platelets: 267 10*3/uL (ref 150–400)
RBC: 4.79 MIL/uL (ref 3.87–5.11)
RDW: 13.5 % (ref 11.5–15.5)
WBC: 7.3 10*3/uL (ref 4.0–10.5)
nRBC: 0 % (ref 0.0–0.2)

## 2018-08-30 LAB — TROPONIN I (HIGH SENSITIVITY): Troponin I (High Sensitivity): 3 ng/L (ref <18)

## 2018-08-30 MED ORDER — SODIUM CHLORIDE 0.9% FLUSH: 3.0000 mL | Freq: Once | INTRAVENOUS | Status: DC

## 2018-08-30 NOTE — ED Triage Notes (Signed)
Patient c/o left chest pain that radiates into the left mid back x 2 days. Patient denies SOB, but c/o headache, and feels like she has to take a breath when she is talking and leg pain. Patient states she just feels weird. 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

## 2018-08-30 NOTE — Telephone Encounter (Addendum)
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  Reason for Disposition . HIGH RISK patient (e.g., age > 72 years, diabetes, heart or lung disease, weak immune system)  Answer Assessment - Initial Assessment Questions 1. COVID-19 DIAGNOSIS: "Who made your Coronavirus (COVID-19) diagnosis?" "Was it confirmed by a positive lab test?" If not diagnosed by a HCP, ask "Are there lots of cases (community spread) where you live?" (See public health department website, if unsure)     Major community spread; pt works in hospital 2. ONSET: "When did the COVID-19 symptoms start?"      6//23/2020 3. WORST SYMPTOM: "What is your worst symptom?" (e.g., cough, fever, shortness of breath, muscle aches)     Chest discomfort 4. COUGH: "Do you have a cough?" If so, ask: "How bad is the cough?"       Yes; pt says that she is a smoker and has a cough at baseline 5. FEVER: "Do you have a fever?" If so, ask: "What is your temperature, how was it measured, and when did it start?"     no 6. RESPIRATORY STATUS: "Describe your breathing?" (e.g., shortness of breath, wheezing, unable to speak)      Chest discomfort under left breast and left upper back 7. BETTER-SAME-WORSE: "Are you getting better, staying the same or getting worse compared to yesterday?"  If getting worse, ask, "In what way?"   Worse; felt better overall yesterday 8. HIGH RISK DISEASE: "Do you have any chronic medical problems?" (e.g., asthma, heart or lung disease,  weak immune system, etc.)   smoker 9. PREGNANCY: "Is there any chance you are pregnant?" "When was your last menstrual period?"     No mirena 10. OTHER SYMPTOMS: "Do you have any other symptoms?"  (e.g., chills, fatigue, headache, loss of smell or taste, muscle pain, sore throat)       Headache, loose stools x 3, back pain  Protocols used: CORONAVIRUS (COVID-19) DIAGNOSED OR SUSPECTED-A-AH

## 2018-08-30 NOTE — Discharge Instructions (Signed)
Your symptoms are most likely acid related, gastritis or reflux.  This can be treated simply with an antacid such as Maalox or Mylanta before meals and at bedtime.  If that does not work after a few days, consider trying Pepcid or Zantac.  If these do not work you could also use Nexium but you have to take that for a long time.  Follow-up with your primary care doctor if not better with these treatments or if having additional symptoms or concerns.  We are adding some information on reflux disease.

## 2018-08-30 NOTE — ED Provider Notes (Signed)
[facility] DEPT Provider Note   CSN: [id number] Arrival date & time: 08/30/18  1334    History   Chief Complaint Chief Complaint  Patient presents with  . Chest Pain  . Headache    HPI [name] is a 33 y.o. female.     HPI   She presents for evaluation of intermittent chest discomfort which is a pressure-like in the center of her chest radiating to her upper back.  Onset of symptoms 2 nights ago, and last night she was unable to sleep much because of it.  No history of reflux or thick ulcer disease.  No vomiting, diarrhea, abdominal pain, lower back pain, fever, chills, cough, shortness of breath, weakness or dizziness.  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  No outside known sick contacts.  There are no other known modifying factors.  Past Medical History:  Diagnosis Date  . Vitamin D deficiency     Patient Active Problem List   Diagnosis Date Noted  . Tobacco abuse 07/25/2014  . Obesity 07/25/2014    Past Surgical History:  Procedure Laterality Date  . LEG SURGERY     left ankle     OB History   No obstetric history on file.      Home Medications    Prior to Admission medications   Medication Sig Start Date End Date Taking? Authorizing Provider  acetaminophen (TYLENOL) 500 MG tablet Take 1,000 mg by mouth every 6 (six) hours as needed for moderate pain.   Yes [provider]  ADDERALL XR 10 MG 24 hr capsule Take 10 mg by mouth daily. 04/03/18  Yes [provider]  meclizine (ANTIVERT) 25 MG tablet Take 25 mg by mouth 3 (three) times daily as needed for dizziness.  02/24/16  Yes [provider]  Multiple Vitamin (MULTIVITAMIN WITH MINERALS) TABS tablet Take 1 tablet by mouth daily.   Yes [provider]  Vitamin D, Ergocalciferol, (DRISDOL) 1.25 MG (50000 UT) CAPS capsule Take 50,000 Units by mouth every 7 (seven) days.    Yes [provider]  buPROPion (WELLBUTRIN XL) 150 MG 24 hr tablet Take 1 tablet (150 mg total) by mouth daily. Patient not taking: Reported on 04/26/2018 04/27/17   Kandis Cocking A, CNM  varenicline (CHANTIX STARTING MONTH PAK) 0.5 MG X 11 & 1 MG X 42 tablet Take one 0.5 mg tablet by mouth once daily for 3 days, then increase to one 0.5 mg tablet twice daily for 4 days, then increase to one 1 mg tablet twice daily. Patient not taking: Reported on 04/27/2017 07/25/14   Morene Crocker, CNM    Family History Family History  Problem Relation Age of Onset  . Hypertension Mother   . Hyperlipidemia Mother   . Hypertension Father   . Hyperlipidemia Father   . Diabetes Paternal Grandmother     Social History Social History   Tobacco Use  . Smoking status: Current Every Day Smoker    Packs/day: 0.50    Types: Cigarettes  . Smokeless tobacco: Never Used  Substance Use Topics  . Alcohol use: Yes    Alcohol/week: 0.0 standard drinks    Comment: occasional  . Drug use: No     Allergies   Patient has no known allergies.   Review of Systems Review of Systems  All other systems reviewed and are negative.    Physical Exam Updated Vital Signs  BP 133/83 (BP Location: Left Arm)   Pulse (!) 59   Temp 98.2 F (36.8 C) (Oral)   Resp 19   Ht 5\' 8"  (1.727 m)   Wt 113.4 kg   SpO2 100%   BMI 38.01 kg/m   Physical Exam Vitals signs and nursing note reviewed.  Constitutional:      General: She is not in acute distress.    Appearance: She is well-developed. She is obese. She is not ill-appearing, toxic-appearing or diaphoretic.  HENT:     Head: Normocephalic and atraumatic.     Right Ear: External ear normal.     Left Ear: External ear normal.     Nose: No congestion or rhinorrhea.  Eyes:     Conjunctiva/sclera: Conjunctivae normal.     Pupils: Pupils are equal, round, and reactive to light.  Neck:     Musculoskeletal: Normal range of motion and neck supple.      Trachea: Phonation normal.  Cardiovascular:     Rate and Rhythm: Normal rate and regular rhythm.     Heart sounds: Normal heart sounds.  Pulmonary:     Effort: Pulmonary effort is normal.     Breath sounds: Normal breath sounds.  Abdominal:     General: There is no distension.     Palpations: Abdomen is soft.     Tenderness: There is no abdominal tenderness.  Musculoskeletal: Normal range of motion.  Skin:    General: Skin is warm and dry.  Neurological:     Mental Status: She is alert and oriented to person, place, and time.     Cranial Nerves: No cranial nerve deficit.     Sensory: No sensory deficit.     Motor: No abnormal muscle tone.     Coordination: Coordination normal.  Psychiatric:        Mood and Affect: Mood normal.        Behavior: Behavior normal.        Thought Content: Thought content normal.        Judgment: Judgment normal.      ED Treatments / Results  Labs (all labs ordered are listed, but only abnormal results are displayed) Labs Reviewed  BASIC METABOLIC PANEL - Abnormal; Notable for the following components:      Result Value   Glucose, Bld 105 (*)    All other components within normal limits  CBC  TROPONIN I (HIGH SENSITIVITY)  TROPONIN I (HIGH SENSITIVITY)  I-STAT BETA HCG BLOOD, ED ([facility], [facility], [facility] ONLY)  I-STAT BETA HCG BLOOD, ED (NOT ORDERABLE)    EKG EKG Interpretation  Date/Time:  Wednesday August 30 2018 20:01:37 EDT Ventricular Rate:  51 PR Interval:    QRS Duration: 97 QT Interval:  427 QTC Calculation: 394 R Axis:   73 Text Interpretation:  Sinus rhythm Since last tracing rate faster Confirmed by Mancel BaleWentz, Kyran Whittier [phone]([id number]) on 08/30/2018 9:24:12 PM      Date: 08/30/18- 14:14  Rate: 59  Rhythm: sinus bradycardia  QRS Axis: normal  PR and QT Intervals: normal  ST/T Wave abnormalities: normal  PR and QRS Conduction Disutrbances:none     Radiology Dg Chest 2 View  Result Date: 08/30/2018 CLINICAL DATA:  Chest pain EXAM: CHEST - 2  VIEW COMPARISON:  None. FINDINGS: Lungs are clear. Heart size and pulmonary vascularity are normal. No adenopathy. No pneumothorax. No bone lesions. IMPRESSION: No edema or consolidation. Electronically Signed   By: Bretta BangWilliam  Woodruff III M.D.   On: 08/30/2018 16:40  Procedures Procedures (including critical care time)  Medications Ordered in ED Medications  sodium chloride flush (NS) 0.9 % injection 3 mL (has no administration in time range)     Initial Impression / Assessment and Plan / ED Course  I have reviewed the triage vital signs and the nursing notes.  Pertinent labs & imaging results that were available during my care of the patient were reviewed by me and considered in my medical decision making (see chart for details).  Clinical Course as of Aug 29 2248  Wed Aug 30, 2018  1949 No edema or infiltrate, images reviewed by me  DG Chest 2 View [EW]  2243 Normal  Troponin I (High Sensitivity) [EW]  2244 Normal  CBC [EW]  2244 Normal  I-Stat beta hCG blood, ED [EW]  2244 Normal  Basic metabolic panel(!) [EW]    Clinical Course User Index [EW] Mancel BaleWentz, Shakai Dolley, MD        Patient Vitals for the past 24 hrs:  BP Temp Temp src Pulse Resp SpO2 Height Weight  08/30/18 1942 133/83 98.2 F (36.8 C) Oral (!) 59 19 100 % - -  08/30/18 1412 - - - - - - 5\' 8"  (1.727 m) 113.4 kg  08/30/18 1410 (!) 124/93 97.8 F (36.6 C) Oral 66 18 100 % - -    10:44 PM Reevaluation with update and discussion. After initial assessment and treatment, an updated evaluation reveals no change in clinical status, findings discussed with the patient and all questions were answered. Mancel BaleElliott Paytience Bures   Medical Decision Making: Treatment chest pain, pressure-like, when supine, with reassuring ED evaluation.  Testing does not indicate any acute unstable cardiac or pulmonary process.  No change in symptoms during time of evaluation.  No indication for hospitalization at this time  CRITICAL CARE-no  Performed by: Mancel BaleElliott Dontay Harm  Nursing Notes Reviewed/ Care Coordinated Applicable Imaging Reviewed Interpretation of Laboratory Data incorporated into ED treatment  The patient appears reasonably screened and/or stabilized for discharge and I doubt any other medical condition or other [facility] requiring further screening, evaluation, or treatment in the ED at this time prior to discharge.  Plan: Home Medications-continue usual medications; Home Treatments-gradually advance diet and activity; return here if the recommended treatment, does not improve the symptoms; Recommended follow up-PCP follow-up PRN   Final Clinical Impressions(s) / ED Diagnoses   Final diagnoses:  Heartburn    ED Discharge Orders    None       Mancel BaleWentz, Embrie Mikkelsen, MD 08/31/18 1051

## 2018-12-19 ENCOUNTER — Other Ambulatory Visit: Payer: Self-pay

## 2018-12-19 DIAGNOSIS — Z20822 Contact with and (suspected) exposure to covid-19: Secondary | ICD-10-CM

## 2018-12-20 LAB — NOVEL CORONAVIRUS, NAA: SARS-CoV-2, NAA: NOT DETECTED

## 2019-01-12 ENCOUNTER — Ambulatory Visit: Payer: Medicaid Other | Admitting: Women's Health

## 2019-01-12 ENCOUNTER — Encounter: Payer: Self-pay | Admitting: Women's Health

## 2019-01-12 ENCOUNTER — Other Ambulatory Visit (HOSPITAL_COMMUNITY)
Admission: RE | Admit: 2019-01-12 | Discharge: 2019-01-12 | Disposition: A | Discharge status: Home / Self Care | Payer: Medicaid Other | Source: Ambulatory Visit | Attending: Women's Health | Admitting: Women's Health

## 2019-01-12 ENCOUNTER — Other Ambulatory Visit: Payer: Self-pay

## 2019-01-12 VITALS — BP 116/76 | HR 71 | Ht 67.0 in | Wt 251.0 lb

## 2019-01-12 DIAGNOSIS — Z113 Encounter for screening for infections with a predominantly sexual mode of transmission: Secondary | ICD-10-CM

## 2019-01-12 DIAGNOSIS — Z0000 Encounter for general adult medical examination without abnormal findings: Secondary | ICD-10-CM | POA: Diagnosis not present

## 2019-01-12 DIAGNOSIS — Z30431 Encounter for routine checking of intrauterine contraceptive device: Secondary | ICD-10-CM

## 2019-01-12 DIAGNOSIS — N924 Excessive bleeding in the premenopausal period: Secondary | ICD-10-CM

## 2019-01-12 DIAGNOSIS — Z01419 Encounter for gynecological examination (general) (routine) without abnormal findings: Secondary | ICD-10-CM

## 2019-01-12 NOTE — Progress Notes (Signed)
GYNECOLOGY ANNUAL PREVENTATIVE CARE ENCOUNTER NOTE  History:     [name] is a 33 y.o. G2P0 female here for a routine annual gynecologic exam.  Current complaints: pt had Mirena IUD inserted 06/2014 and pt has a regular period with Mirena for the past year and is inquiring to know if this is a problem as she had no bleeding for the first 2-3 years after insertion. Pt denies attempting to find strings. Denies abnormal vaginal bleeding, discharge, pelvic pain, problems with intercourse or other gynecologic concerns.  Pt desires STD testing today. Pt reports she does perform SBE. Pt bowel or bladder concerns. No family hx of endometrial or ovarian cancer. Pt reports PGM dx with breast cancer @33yo . Pt reports MGGM had colon cancer, age unknown, pt states she was "older." Pt does not smoke, drink or use drugs. Pt reports she vapes at this time, but it does not have nicotine. Last dental exam: summer 2020. Last eye exam: one month ago.      Gynecologic History Patient's last menstrual period was 12/29/2018. Menstruation: once per month, 5-6 days, moderate bleeding, mild dysmenorrhea. Contraception: IUD. Pt reports she does not desire pregnancy in the next year. Last Pap: 2019. Results were: NILM/HPV negative per EPIC. Pt reports history of abnormal Pap and reports this normal Pap in 2019 was the f/u for an abnormal Pap in January 2018. Pt denies history of colposcopy or LEEP/[facility].  Obstetric History OB History  Gravida Para Term Preterm AB Living  2         2  SAB TAB Ectopic Multiple Live Births               # Outcome Date GA Lbr Len/2nd Weight Sex Delivery Anes PTL Lv  2 Grav[place]           1 [name]             Past Medical History:  Diagnosis Date  . Vitamin D deficiency     Past Surgical History:  Procedure Laterality Date  . LEG SURGERY     left ankle    Current Outpatient Medications on File Prior to Visit  Medication Sig Dispense Refill  . acetaminophen  (TYLENOL) 500 MG tablet Take 1,000 mg by mouth every 6 (six) hours as needed for moderate pain.    . ADDERALL XR 10 MG 24 hr capsule Take 10 mg by mouth daily.    Marland Kitchen levonorgestrel (MIRENA) 20 MCG/24HR IUD 1 each by Intrauterine route once.    . Multiple Vitamin (MULTIVITAMIN WITH MINERALS) TABS tablet Take 1 tablet by mouth daily.    Marland Kitchen buPROPion (WELLBUTRIN XL) 150 MG 24 hr tablet Take 1 tablet (150 mg total) by mouth daily. (Patient not taking: Reported on 04/26/2018) 30 tablet 12  . meclizine (ANTIVERT) 25 MG tablet Take 25 mg by mouth 3 (three) times daily as needed for dizziness.     . varenicline (CHANTIX STARTING MONTH PAK) 0.5 MG X 11 & 1 MG X 42 tablet Take one 0.5 mg tablet by mouth once daily for 3 days, then increase to one 0.5 mg tablet twice daily for 4 days, then increase to one 1 mg tablet twice daily. (Patient not taking: Reported on 04/27/2017) 53 tablet 0  . Vitamin D, Ergocalciferol, (DRISDOL) 1.25 MG (50000 UT) CAPS capsule Take 50,000 Units by mouth every 7 (seven) days.     No current facility-administered medications on file prior to visit.     No Known  Allergies  Social History:  reports that she has quit smoking. Her smoking use included cigarettes. She smoked 0.00 packs per day. She has never used smokeless tobacco. She reports current alcohol use. She reports that she does not use drugs.  Family History  Problem Relation Age of Onset  . Hypertension Mother   . Hyperlipidemia Mother   . Hypertension Father   . Hyperlipidemia Father   . Diabetes Paternal Grandmother   . Breast cancer Paternal Grandmother     The following portions of the patient's history were reviewed and updated as appropriate: allergies, current medications, past family history, past medical history, past social history, past surgical history and problem list.  Review of Systems Pertinent items noted in HPI and remainder of comprehensive ROS otherwise negative.  Physical Exam:  BP 116/76    Pulse 71   Ht 5\' 7"  (1.702 m)   Wt 251 lb (113.9 kg)   LMP 12/29/2018   BMI 39.31 kg/m  CONSTITUTIONAL: Well-developed, well-nourished female in no acute distress.  HENT:  Normocephalic, atraumatic, External right and left ear normal. EYES: Conjunctivae and EOM are normal. Pupils are equal, round, and reactive to light. No scleral icterus.  NECK: Normal range of motion, supple, no masses.  Normal thyroid.  SKIN: Skin is warm and dry. No rash noted. Not diaphoretic. No erythema. No pallor. MUSCULOSKELETAL: Normal range of motion. No tenderness.  No cyanosis, clubbing, or edema. NEUROLOGIC: Alert and oriented to person, place, and time. Normal reflexes, muscle tone coordination. PSYCHIATRIC: Normal mood and affect. Normal behavior. Normal judgment and thought content. CARDIOVASCULAR: Normal heart rate noted, regular rhythm. RESPIRATORY: Clear to auscultation bilaterally. Effort and breath sounds normal, no problems with respiration noted. BREASTS: Symmetric in size. No masses, skin changes, nipple drainage, or lymphadenopathy. ABDOMEN: Soft, normal bowel sounds, no distention noted.  No tenderness, rebound or guarding.  PELVIC: Normal appearing external genitalia; normal appearing vaginal mucosa and cervix.  No abnormal discharge noted.  Pap smear not obtained.  Normal uterine size, no other palpable masses, no uterine or adnexal tenderness. IUD strings visualized, body of IUD not palpable at os.   Assessment and Plan:       1. Well woman exam -normal exam  2. Screening for STDs (sexually transmitted diseases) - Cervicovaginal ancillary only( [place]) - HIV antibody (with reflex) - RPR - Hepatitis B Surface AntiGEN - Hepatitis C Antibody  3. IUD check up - discussed normal expectations with bleeding with Mirena IUD - 12/31/2018 Transvaginal Non-OB; Future  4. Abnormal vaginal bleeding in premenopausal patient - US Transvaginal Non-OB; Future - Thyroid panel ordered  Will follow up  results of pap smear and other testing, if performed, and manage accordingly. Routine preventative health maintenance measures emphasized. Self-breast awareness taught, importance discussed, advised when to RTC, SBA literature given. Please refer to After Visit Summary for other counseling recommendations.      [place], [facility] Women's Health Nurse Practitioner, [facility] for [facility]

## 2019-01-12 NOTE — Progress Notes (Signed)
Patient presents for Annual Exam Today.  LMP: 12/29/18 Monthly lasting 3-5 days, moderate flow.  Last Pap:04/27/2017 WNL  Contraception: Mirena  IUD  Inserted 06/25/2014 STD Screening: Desires Full Panel  *Recently PGM at age 33 Diagnosed with Breast Cancer  CC: pt wants to discuss periods w/ IUD. Pt notes having periods x 1 yr now.

## 2019-01-12 NOTE — Patient Instructions (Addendum)
Preventive Care 20-32 Years Old, Female Preventive care refers to visits with your health care provider and lifestyle choices that can promote health and wellness. This includes:  A yearly physical exam. This may also be called an annual well check.  Regular dental visits and eye exams.  Immunizations.  Screening for certain conditions.  Healthy lifestyle choices, such as eating a healthy diet, getting regular exercise, not using drugs or products that contain nicotine and tobacco, and limiting alcohol use. What can I expect for my preventive care visit? Physical exam Your health care provider will check your:  Height and weight. This may be used to calculate body mass index (BMI), which tells if you are at a healthy weight.  Heart rate and blood pressure.  Skin for abnormal spots. Counseling Your health care provider may ask you questions about your:  Alcohol, tobacco, and drug use.  Emotional well-being.  Home and relationship well-being.  Sexual activity.  Eating habits.  Work and work Statistician.  Method of birth control.  Menstrual cycle.  Pregnancy history. What immunizations do I need?  Influenza (flu) vaccine  This is recommended every year. Tetanus, diphtheria, and pertussis (Tdap) vaccine  You may need a Td booster every 10 years. Varicella (chickenpox) vaccine  You may need this if you have not been vaccinated. Human papillomavirus (HPV) vaccine  If recommended by your health care provider, you may need three doses over 6 months. Measles, mumps, and rubella (MMR) vaccine  You may need at least one dose of MMR. You may also need a second dose. Meningococcal conjugate (MenACWY) vaccine  One dose is recommended if you are age 32-21 years and a first-year college student living in a residence hall, or if you have one of several medical conditions. You may also need additional booster doses. Pneumococcal conjugate (PCV13) vaccine  You may need  this if you have certain conditions and were not previously vaccinated. Pneumococcal polysaccharide (PPSV23) vaccine  You may need one or two doses if you smoke cigarettes or if you have certain conditions. Hepatitis A vaccine  You may need this if you have certain conditions or if you travel or work in places where you may be exposed to hepatitis A. Hepatitis B vaccine  You may need this if you have certain conditions or if you travel or work in places where you may be exposed to hepatitis B. Haemophilus influenzae type b (Hib) vaccine  You may need this if you have certain conditions. You may receive vaccines as individual doses or as more than one vaccine together in one shot (combination vaccines). Talk with your health care provider about the risks and benefits of combination vaccines. What tests do I need?  Blood tests  Lipid and cholesterol levels. These may be checked every 5 years starting at age 32.  Hepatitis C test.  Hepatitis B test. Screening  Diabetes screening. This is done by checking your blood sugar (glucose) after you have not eaten for a while (fasting).  Sexually transmitted disease (STD) testing.  BRCA-related cancer screening. This may be done if you have a family history of breast, ovarian, tubal, or peritoneal cancers.  Pelvic exam and Pap test. This may be done every 3 years starting at age 32. Starting at age 32, this may be done every 5 years if you have a Pap test in combination with an HPV test. Talk with your health care provider about your test results, treatment options, and if necessary, the need for more tests.  Follow these instructions at home: Eating and drinking   Eat a diet that includes fresh fruits and vegetables, whole grains, lean protein, and low-fat dairy.  Take vitamin and mineral supplements as recommended by your health care provider.  Do not drink alcohol if: ? Your health care provider tells you not to drink. ? You are  pregnant, may be pregnant, or are planning to become pregnant.  If you drink alcohol: ? Limit how much you have to 0-1 drink a day. ? Be aware of how much alcohol is in your drink. In the [place] one drink equals one 12 oz bottle of beer (355 mL), one 5 oz glass of wine (148 mL), or one 1 oz glass of hard liquor (44 mL). Lifestyle  Take daily care of your teeth and gums.  Stay active. Exercise for at least 30 minutes on 5 or more days each week.  Do not use any products that contain nicotine or tobacco, such as cigarettes, e-cigarettes, and chewing tobacco. If you need help quitting, ask your health care provider.  If you are sexually active, practice safe sex. Use a condom or other form of birth control (contraception) in order to prevent pregnancy and STIs (sexually transmitted infections). If you plan to become pregnant, see your health care provider for a preconception visit. What's next?  Visit your health care provider once a year for a well check visit.  Ask your health care provider how often you should have your eyes and teeth checked.  Stay up to date on all vaccines. This information is not intended to replace advice given to you by your health care provider. Make sure you discuss any questions you have with your health care provider. Document Released: 04/20/2001 Document Revised: 11/03/2017 Document Reviewed: 11/03/2017 Elsevier Patient Education  2020 [place] with Quitting Smoking  Quitting smoking is a physical and mental challenge. You will face cravings, withdrawal symptoms, and temptation. Before quitting, work with your health care provider to make a plan that can help you cope. Preparation can help you quit and keep you from giving in. How can I cope with cravings? Cravings usually last for 5-10 minutes. If you get through it, the craving will pass. Consider taking the following actions to help you cope with cravings:  Keep your mouth busy: ? Chew  sugar-free gum. ? Suck on hard candies or a straw. ? Brush your teeth.  Keep your hands and body busy: ? Immediately change to a different activity when you feel a craving. ? Squeeze or play with a ball. ? Do an activity or a hobby, like making bead jewelry, practicing needlepoint, or working with wood. ? Mix up your normal routine. ? Take a short exercise break. Go for a quick walk or run up and down stairs. ? Spend time in public places where smoking is not allowed.  Focus on doing something kind or helpful for someone else.  Call a friend or family member to talk during a craving.  Join a support group.  Call a quit line, such as 1-800-QUIT-NOW.  Talk with your health care provider about medicines that might help you cope with cravings and make quitting easier for you. How can I deal with withdrawal symptoms? Your body may experience negative effects as it tries to get used to not having nicotine in the system. These effects are called withdrawal symptoms. They may include:  Feeling hungrier than normal.  Trouble concentrating.  Irritability.  Trouble sleeping.  Feeling depressed.  Restlessness and agitation.  Craving a cigarette. To manage withdrawal symptoms:  Avoid places, people, and activities that trigger your cravings.  Remember why you want to quit.  Get plenty of sleep.  Avoid coffee and other caffeinated drinks. These may worsen some of your symptoms. How can I handle social situations? Social situations can be difficult when you are quitting smoking, especially in the first few weeks. To manage this, you can:  Avoid parties, bars, and other social situations where people might be smoking.  Avoid alcohol.  Leave right away if you have the urge to smoke.  Explain to your family and friends that you are quitting smoking. Ask for understanding and support.  Plan activities with friends or family where smoking is not an option. What are some ways I  can cope with stress? Wanting to smoke may cause stress, and stress can make you want to smoke. Find ways to manage your stress. Relaxation techniques can help. For example:  Breathe slowly and deeply, in through your nose and out through your mouth.  Listen to soothing, relaxing music.  Talk with a family member or friend about your stress.  Light a candle.  Soak in a bath or take a shower.  Think about a peaceful place. What are some ways I can prevent weight gain? Be aware that many people gain weight after they quit smoking. However, not everyone does. To keep from gaining weight, have a plan in place before you quit and stick to the plan after you quit. Your plan should include:  Having healthy snacks. When you have a craving, it may help to: ? Eat plain popcorn, crunchy carrots, celery, or other cut vegetables. ? Chew sugar-free gum.  Changing how you eat: ? Eat small portion sizes at meals. ? Eat 4-6 small meals throughout the day instead of 1-2 large meals a day. ? Be mindful when you eat. Do not watch television or do other things that might distract you as you eat.  Exercising regularly: ? Make time to exercise each day. If you do not have time for a long workout, do short bouts of exercise for 5-10 minutes several times a day. ? Do some form of strengthening exercise, like weight lifting, and some form of aerobic exercise, like running or swimming.  Drinking plenty of water or other low-calorie or no-calorie drinks. Drink 6-8 glasses of water daily, or as much as instructed by your health care provider. Summary  Quitting smoking is a physical and mental challenge. You will face cravings, withdrawal symptoms, and temptation to smoke again. Preparation can help you as you go through these challenges.  You can cope with cravings by keeping your mouth busy (such as by chewing gum), keeping your body and hands busy, and making calls to family, friends, or a helpline for  people who want to quit smoking.  You can cope with withdrawal symptoms by avoiding places where people smoke, avoiding drinks with caffeine, and getting plenty of rest.  Ask your health care provider about the different ways to prevent weight gain, avoid stress, and handle social situations. This information is not intended to replace advice given to you by your health care provider. Make sure you discuss any questions you have with your health care provider. Document Released: 02/20/2016 Document Revised: 02/04/2017 Document Reviewed: 02/20/2016 Elsevier Patient Education  2020 [place] Breast self-awareness means being familiar with how your breasts look and feel. It involves checking your  breasts regularly and reporting any changes to your health care provider. Practicing breast self-awareness is important. Sometimes changes may not be harmful (are benign), but sometimes a change in your breasts can be a sign of a serious medical problem. It is important to learn how to do this procedure correctly so that you can catch problems early, when treatment is more likely to be successful. All women should practice breast self-awareness, including women who have had breast implants. What you need:  A mirror.  A well-lit room. How to do a breast self-exam A breast self-exam is one way to learn what is normal for your breasts and whether your breasts are changing. To do a breast self-exam: Look for changes  1. Remove all the clothing above your waist. 2. Stand in front of a mirror in a room with good lighting. 3. Put your hands on your hips. 4. Push your hands firmly downward. 5. Compare your breasts in the mirror. Look for differences between them (asymmetry), such as: ? Differences in shape. ? Differences in size. ? Puckers, dips, and bumps in one breast and not the other. 6. Look at each breast for changes in the skin, such as: ? Redness. ? Scaly  areas. 7. Look for changes in your nipples, such as: ? Discharge. ? Bleeding. ? Dimpling. ? Redness. ? A change in position. Feel for changes Carefully feel your breasts for lumps and changes. It is best to do this while lying on your back on the floor, and again while sitting or standing in the tub or shower with soapy water on your skin. Feel each breast in the following way: 1. Place the arm on the side of the breast you are examining above your head. 2. Feel your breast with the other hand. 3. Start in the nipple area and make -inch (2 cm) overlapping circles to feel your breast. Use the pads of your three middle fingers to do this. Apply light pressure, then medium pressure, then firm pressure. The light pressure will allow you to feel the tissue closest to the skin. The medium pressure will allow you to feel the tissue that is a little deeper. The firm pressure will allow you to feel the tissue close to the ribs. 4. Continue the overlapping circles, moving downward over the breast until you feel your ribs below your breast. 5. Move one finger-width toward the center of the body. Continue to use the -inch (2 cm) overlapping circles to feel your breast as you move slowly up toward your collarbone. 6. Continue the up-and-down exam using all three pressures until you reach your armpit.  Write down what you find Writing down what you find can help you remember what to discuss with your health care provider. Write down:  What is normal for each breast.  Any changes that you find in each breast, including: ? The kind of changes you find. ? Any pain or tenderness. ? Size and location of any lumps.  Where you are in your menstrual cycle, if you are still menstruating. General tips and recommendations  Examine your breasts every month.  If you are breastfeeding, the best time to examine your breasts is after a feeding or after using a breast pump.  If you menstruate, the best time to  examine your breasts is 5-7 days after your period. Breasts are generally lumpier during menstrual periods, and it may be more difficult to notice changes.  With time and practice, you will become more familiar with  the variations in your breasts and more comfortable with the exam. Contact a health care provider if you:  See a change in the shape or size of your breasts or nipples.  See a change in the skin of your breast or nipples, such as a reddened or scaly area.  Have unusual discharge from your nipples.  Find a lump or thick area that was not there before.  Have pain in your breasts.  Have any concerns related to your breast health. Summary  Breast self-awareness includes looking for physical changes in your breasts, as well as feeling for any changes within your breasts.  Breast self-awareness should be performed in front of a mirror in a well-lit room.  You should examine your breasts every month. If you menstruate, the best time to examine your breasts is 5-7 days after your menstrual period.  Let your health care provider know of any changes you notice in your breasts, including changes in size, changes on the skin, pain or tenderness, or unusual fluid from your nipples. This information is not intended to replace advice given to you by your health care provider. Make sure you discuss any questions you have with your health care provider. Document Released: 02/22/2005 Document Revised: 10/11/2017 Document Reviewed: 10/11/2017 Elsevier Patient Education  [place].  Levonorgestrel intrauterine device (IUD) What is this medicine? LEVONORGESTREL IUD (LEE voe nor jes trel) is a contraceptive (birth control) device. The device is placed inside the uterus by a healthcare professional. It is used to prevent pregnancy. This device can also be used to treat heavy bleeding that occurs during your period. This medicine may be used for other purposes; ask your health care  provider or pharmacist if you have questions. COMMON BRAND NAME(S): Minette Headland What should I tell my health care provider before I take this medicine? They need to know if you have any of these conditions:  abnormal Pap smear  cancer of the breast, uterus, or cervix  diabetes  endometritis  genital or pelvic infection now or in the past  have more than one sexual partner or your partner has more than one partner  heart disease  history of an ectopic or tubal pregnancy  immune system problems  IUD in place  liver disease or tumor  problems with blood clots or take blood-thinners  seizures  use intravenous drugs  uterus of unusual shape  vaginal bleeding that has not been explained  an unusual or allergic reaction to levonorgestrel, other hormones, silicone, or polyethylene, medicines, foods, dyes, or preservatives  pregnant or trying to get pregnant  breast-feeding How should I use this medicine? This device is placed inside the uterus by a health care professional. Talk to your pediatrician regarding the use of this medicine in children. Special care may be needed. Overdosage: If you think you have taken too much of this medicine contact a poison control center or emergency room at once. NOTE: This medicine is only for you. Do not share this medicine with others. What if I miss a dose? This does not apply. Depending on the brand of device you have inserted, the device will need to be replaced every 3 to 6 years if you wish to continue using this type of birth control. What may interact with this medicine? Do not take this medicine with any of the following medications:  amprenavir  bosentan  fosamprenavir This medicine may also interact with the following medications:  aprepitant  armodafinil  barbiturate medicines  for inducing sleep or treating seizures  bexarotene  boceprevir  griseofulvin  medicines to treat seizures like  carbamazepine, ethotoin, felbamate, oxcarbazepine, phenytoin, topiramate  modafinil  pioglitazone  rifabutin  rifampin  rifapentine  some medicines to treat HIV infection like atazanavir, efavirenz, indinavir, lopinavir, nelfinavir, tipranavir, ritonavir  St. John's wort  warfarin This list may not describe all possible interactions. Give your health care provider a list of all the medicines, herbs, non-prescription drugs, or dietary supplements you use. Also tell them if you smoke, drink alcohol, or use illegal drugs. Some items may interact with your medicine. What should I watch for while using this medicine? Visit your doctor or health care professional for regular check ups. See your doctor if you or your partner has sexual contact with others, becomes HIV positive, or gets a sexual transmitted disease. This product does not protect you against HIV infection (AIDS) or other sexually transmitted diseases. You can check the placement of the IUD yourself by reaching up to the top of your vagina with clean fingers to feel the threads. Do not pull on the threads. It is a good habit to check placement after each menstrual period. Call your doctor right away if you feel more of the IUD than just the threads or if you cannot feel the threads at all. The IUD may come out by itself. You may become pregnant if the device comes out. If you notice that the IUD has come out use a backup birth control method like condoms and call your health care provider. Using tampons will not change the position of the IUD and are okay to use during your period. This IUD can be safely scanned with magnetic resonance imaging (MRI) only under specific conditions. Before you have an MRI, tell your healthcare provider that you have an IUD in place, and which type of IUD you have in place. What side effects may I notice from receiving this medicine? Side effects that you should report to your doctor or health care  professional as soon as possible:  allergic reactions like skin rash, itching or hives, swelling of the face, lips, or tongue  fever, flu-like symptoms  genital sores  high blood pressure  no menstrual period for 6 weeks during use  pain, swelling, warmth in the leg  pelvic pain or tenderness  severe or sudden headache  signs of pregnancy  stomach cramping  sudden shortness of breath  trouble with balance, talking, or walking  unusual vaginal bleeding, discharge  yellowing of the eyes or skin Side effects that usually do not require medical attention (report to your doctor or health care professional if they continue or are bothersome):  acne  breast pain  change in sex drive or performance  changes in weight  cramping, dizziness, or faintness while the device is being inserted  headache  irregular menstrual bleeding within first 3 to 6 months of use  nausea This list may not describe all possible side effects. Call your doctor for medical advice about side effects. You may report side effects to FDA at 1-800-FDA-1088. Where should I keep my medicine? This does not apply. NOTE: This sheet is a summary. It may not cover all possible information. If you have questions about this medicine, talk to your doctor, pharmacist, or health care provider.  2020 Elsevier/Gold Standard (2018-01-03 13:22:01)  Abnormal Uterine Bleeding Abnormal uterine bleeding is unusual bleeding from the uterus. It includes:  Bleeding or spotting between periods.  Bleeding after sex.  Bleeding that is heavier than normal.  Periods that last longer than usual.  Bleeding after menopause. Abnormal uterine bleeding can affect women at various stages in life, including teenagers, women in their reproductive years, pregnant women, and women who have reached menopause. Common causes of abnormal uterine bleeding include:  Pregnancy.  Growths of tissue (polyps).  A noncancerous tumor  in the uterus (fibroid).  Infection.  Cancer.  Hormonal imbalances. Any type of abnormal bleeding should be evaluated by a health care provider. Many cases are minor and simple to treat, while others are more serious. Treatment will depend on the cause of the bleeding. Follow these instructions at home:  Monitor your condition for any changes.  Do not use tampons, douche, or have sex if told by your health care provider.  Change your pads often.  Get regular exams that include pelvic exams and cervical cancer screening.  Keep all follow-up visits as told by your health care provider. This is important. Contact a health care provider if:  Your bleeding lasts for more than one week.  You feel dizzy at times.  You feel nauseous or you vomit. Get help right away if:  You pass out.  Your bleeding soaks through a pad every hour.  You have abdominal pain.  You have a fever.  You become sweaty or weak.  You pass large blood clots from your vagina. Summary  Abnormal uterine bleeding is unusual bleeding from the uterus.  Any type of abnormal bleeding should be evaluated by a health care provider. Many cases are minor and simple to treat, while others are more serious.  Treatment will depend on the cause of the bleeding. This information is not intended to replace advice given to you by your health care provider. Make sure you discuss any questions you have with your health care provider. Document Released: 02/22/2005 Document Revised: 06/01/2017 Document Reviewed: 03/26/2016 Elsevier Patient Education  2020 [employer].

## 2019-01-13 LAB — HIV ANTIBODY (ROUTINE TESTING W REFLEX): HIV Screen 4th Generation wRfx: NONREACTIVE

## 2019-01-13 LAB — HEPATITIS C ANTIBODY: Hep C Virus Ab: <0.1 s/co ratio (ref 0.0–0.9)

## 2019-01-13 LAB — HEPATITIS B SURFACE ANTIGEN: Hepatitis B Surface Ag: NEGATIVE

## 2019-01-13 LAB — RPR: RPR Ser Ql: NONREACTIVE

## 2019-01-16 LAB — CERVICOVAGINAL ANCILLARY ONLY
Chlamydia: NEGATIVE
Comment: NEGATIVE
Comment: NEGATIVE
Comment: NORMAL
Neisseria Gonorrhea: NEGATIVE
Trichomonas: NEGATIVE

## 2019-01-17 NOTE — Progress Notes (Signed)
MyChart Message to Patient:  Good morning [name],  Your STD test results came back and you are negative for HIV, syphilis, hepatitis B, hepatitis C, gonorrhea, chlamydia and trichomonas.  If you have any questions about these results, please do not hesitate to call our office.  Thank you, [name]

## 2019-01-24 ENCOUNTER — Other Ambulatory Visit: Payer: Self-pay

## 2019-01-24 ENCOUNTER — Ambulatory Visit (HOSPITAL_COMMUNITY)
Admission: RE | Admit: 2019-01-24 | Discharge: 2019-01-24 | Disposition: A | Discharge status: Home / Self Care | Payer: Medicaid Other | Source: Ambulatory Visit | Attending: Women's Health | Admitting: Women's Health

## 2019-01-24 DIAGNOSIS — Z30431 Encounter for routine checking of intrauterine contraceptive device: Secondary | ICD-10-CM | POA: Diagnosis present

## 2019-01-24 DIAGNOSIS — N924 Excessive bleeding in the premenopausal period: Secondary | ICD-10-CM | POA: Insufficient documentation

## 2019-01-25 NOTE — Progress Notes (Signed)
MyChart message to patient:  Good morning [name],  According to your ultrasound results, your IUD is in the appropriate place.  If you have any additional questions or would like to talk further about your bleeding, please call the office.  Thank you, [name]

## 2019-01-26 LAB — SPECIMEN STATUS REPORT

## 2019-01-26 LAB — THYROID PANEL WITH TSH
Free Thyroxine Index: 2 (ref 1.2–4.9)
T3 Uptake Ratio: 30 % (ref 24–39)
T4, Total: 6.8 ug/dL (ref 4.5–12.0)
TSH: 0.576 u[IU]/mL (ref 0.450–4.500)

## 2019-01-26 NOTE — Progress Notes (Signed)
MyChart message to patient.  

## 2019-06-26 ENCOUNTER — Other Ambulatory Visit (HOSPITAL_COMMUNITY)
Admission: RE | Admit: 2019-06-26 | Discharge: 2019-06-26 | Disposition: A | Discharge status: Home / Self Care | Payer: PRIVATE HEALTH INSURANCE | Source: Ambulatory Visit | Attending: Obstetrics | Admitting: Obstetrics

## 2019-06-26 ENCOUNTER — Ambulatory Visit (INDEPENDENT_AMBULATORY_CARE_PROVIDER_SITE_OTHER): Payer: PRIVATE HEALTH INSURANCE | Admitting: Obstetrics

## 2019-06-26 ENCOUNTER — Encounter: Payer: Self-pay | Admitting: Obstetrics

## 2019-06-26 ENCOUNTER — Other Ambulatory Visit: Payer: Self-pay

## 2019-06-26 VITALS — BP 121/73 | HR 76 | Ht 67.0 in | Wt 267.0 lb

## 2019-06-26 DIAGNOSIS — Z3009 Encounter for other general counseling and advice on contraception: Secondary | ICD-10-CM

## 2019-06-26 DIAGNOSIS — N898 Other specified noninflammatory disorders of vagina: Secondary | ICD-10-CM

## 2019-06-26 DIAGNOSIS — Z30432 Encounter for removal of intrauterine contraceptive device: Secondary | ICD-10-CM | POA: Diagnosis not present

## 2019-06-26 NOTE — Progress Notes (Signed)
Pt is in the office for IUD removal, wants to discuss other [facility] options for the future.

## 2019-06-26 NOTE — Patient Instructions (Signed)
Contraception Choices Contraception, also called birth control, refers to methods or devices that prevent pregnancy. Hormonal methods Contraceptive implant  A contraceptive implant is a thin, plastic tube that contains a hormone. It is inserted into the upper part of the arm. It can remain in place for up to 3 years. Progestin-only injections Progestin-only injections are injections of progestin, a synthetic form of the hormone progesterone. They are given every 3 months by a health care provider. Birth control pills  Birth control pills are pills that contain hormones that prevent pregnancy. They must be taken once a day, preferably at the same time each day. Birth control patch  The birth control patch contains hormones that prevent pregnancy. It is placed on the skin and must be changed once a week for three weeks and removed on the fourth week. A prescription is needed to use this method of contraception. Vaginal ring  A vaginal ring contains hormones that prevent pregnancy. It is placed in the vagina for three weeks and removed on the fourth week. After that, the process is repeated with a new ring. A prescription is needed to use this method of contraception. Emergency contraceptive Emergency contraceptives prevent pregnancy after unprotected sex. They come in pill form and can be taken up to 5 days after sex. They work best the sooner they are taken after having sex. Most emergency contraceptives are available without a prescription. This method should not be used as your only form of birth control. Barrier methods Female condom  A female condom is a thin sheath that is worn over the penis during sex. Condoms keep sperm from going inside a woman's body. They can be used with a spermicide to increase their effectiveness. They should be disposed after a single use. Female condom  A female condom is a soft, loose-fitting sheath that is put into the vagina before sex. The condom keeps sperm  from going inside a woman's body. They should be disposed after a single use. Diaphragm  A diaphragm is a soft, dome-shaped barrier. It is inserted into the vagina before sex, along with a spermicide. The diaphragm blocks sperm from entering the uterus, and the spermicide kills sperm. A diaphragm should be left in the vagina for 6-8 hours after sex and removed within 24 hours. A diaphragm is prescribed and fitted by a health care provider. A diaphragm should be replaced every 1-2 years, after giving birth, after gaining more than 15 lb (6.8 kg), and after pelvic surgery. Cervical cap  A cervical cap is a round, soft latex or plastic cup that fits over the cervix. It is inserted into the vagina before sex, along with spermicide. It blocks sperm from entering the uterus. The cap should be left in place for 6-8 hours after sex and removed within 48 hours. A cervical cap must be prescribed and fitted by a health care provider. It should be replaced every 2 years. Sponge  A sponge is a soft, circular piece of polyurethane foam with spermicide on it. The sponge helps block sperm from entering the uterus, and the spermicide kills sperm. To use it, you make it wet and then insert it into the vagina. It should be inserted before sex, left in for at least 6 hours after sex, and removed and thrown away within 30 hours. Spermicides Spermicides are chemicals that kill or block sperm from entering the cervix and uterus. They can come as a cream, jelly, suppository, foam, or tablet. A spermicide should be inserted into the   vagina with an applicator at least 10-15 minutes before sex to allow time for it to work. The process must be repeated every time you have sex. Spermicides do not require a prescription. Intrauterine contraception Intrauterine device (IUD) An IUD is a T-shaped device that is put in a woman's uterus. There are two types:  Hormone IUD.This type contains progestin, a synthetic form of the hormone  progesterone. This type can stay in place for 3-5 years.  Copper IUD.This type is wrapped in copper wire. It can stay in place for 10 years.  Permanent methods of contraception Female tubal ligation In this method, a woman's fallopian tubes are sealed, tied, or blocked during surgery to prevent eggs from traveling to the uterus. Hysteroscopic sterilization In this method, a small, flexible insert is placed into each fallopian tube. The inserts cause scar tissue to form in the fallopian tubes and block them, so sperm cannot reach an egg. The procedure takes about 3 months to be effective. Another form of birth control must be used during those 3 months. Female sterilization This is a procedure to tie off the tubes that carry sperm (vasectomy). After the procedure, the man can still ejaculate fluid (semen). Natural planning methods Natural family planning In this method, a couple does not have sex on days when the woman could become pregnant. Calendar method This means keeping track of the length of each menstrual cycle, identifying the days when pregnancy can happen, and not having sex on those days. Ovulation method In this method, a couple avoids sex during ovulation. Symptothermal method This method involves not having sex during ovulation. The woman typically checks for ovulation by watching changes in her temperature and in the consistency of cervical mucus. Post-ovulation method In this method, a couple waits to have sex until after ovulation. Summary  Contraception, also called birth control, means methods or devices that prevent pregnancy.  Hormonal methods of contraception include implants, injections, pills, patches, vaginal rings, and emergency contraceptives.  Barrier methods of contraception can include female condoms, female condoms, diaphragms, cervical caps, sponges, and spermicides.  There are two types of IUDs (intrauterine devices). An IUD can be put in a woman's uterus to  prevent pregnancy for 3-5 years.  Permanent sterilization can be done through a procedure for males, females, or both.  Natural family planning methods involve not having sex on days when the woman could become pregnant. This information is not intended to replace advice given to you by your health care provider. Make sure you discuss any questions you have with your health care provider. Document Revised: 02/24/2017 Document Reviewed: 03/27/2016 Elsevier Patient Education  2020 [employer]vier Inc[employer]  

## 2019-06-26 NOTE — Progress Notes (Signed)
    GYNECOLOGY OFFICE PROCEDURE NOTE  [name] is a 34 y.o. G2P0 here for Liletta IUD removal. No GYN concerns.  Last pap smear was on 04-27-2017 and was normal.  IUD Removal  Patient identified, informed consent performed, consent signed.  Patient was in the dorsal lithotomy position, normal external genitalia was noted.  A speculum was placed in the patient's vagina, normal discharge was noted, no lesions. The cervix was visualized, no lesions, no abnormal discharge.  The strings of the IUD were grasped and pulled using ring forceps. The IUD was removed in its entirety.  Patient tolerated the procedure well.    Patient will use nothing for contraception, plans for pregnancy soon and she was told to avoid teratogens, take PNV and folic acid.  Routine preventative health maintenance measures emphasized.   Brock Bad, MD, FACOG Obstetrician & Gynecologist, [facility] for [facility], [facility] 06/26/2019

## 2019-06-28 ENCOUNTER — Other Ambulatory Visit: Payer: Self-pay | Admitting: Obstetrics

## 2019-06-28 DIAGNOSIS — N760 Acute vaginitis: Secondary | ICD-10-CM

## 2019-06-28 DIAGNOSIS — B9689 Other specified bacterial agents as the cause of diseases classified elsewhere: Secondary | ICD-10-CM

## 2019-06-28 LAB — CERVICOVAGINAL ANCILLARY ONLY
Bacterial Vaginitis (gardnerella): POSITIVE — AB
Chlamydia: NEGATIVE
Comment: NEGATIVE
Comment: NEGATIVE
Comment: NORMAL
Neisseria Gonorrhea: NEGATIVE

## 2019-06-28 MED ORDER — METRONIDAZOLE 500 MG PO TABS: 500.0000 mg | ORAL_TABLET | Freq: Two times a day (BID) | ORAL | 2 refills | Status: DC

## 2019-07-19 ENCOUNTER — Other Ambulatory Visit: Payer: Self-pay

## 2019-07-19 MED ORDER — FLUCONAZOLE 150 MG PO TABS: 150.0000 mg | ORAL_TABLET | Freq: Once | ORAL | 0 refills | Status: AC

## 2019-07-19 NOTE — Progress Notes (Signed)
Pt is experiencing vaginal itching and discharge after antibiotic use.  Diflucan sent per protocol

## 2019-09-03 ENCOUNTER — Telehealth: Payer: Self-pay | Admitting: Emergency Medicine

## 2019-09-03 NOTE — Telephone Encounter (Signed)
[name] ([name]) - [phone] Linzess capsules     Status: PA Response - Approved

## 2021-02-27 IMAGING — US US TRANSVAGINAL NON-OB
1 series · 15 of 25 positions shown · non-contrast
Comparison: None

CLINICAL DATA: Abnormal vaginal bleeding in a premenopausal
patient, IUD checkup

EXAM:
ULTRASOUND PELVIS TRANSVAGINAL
TECHNIQUE: Transvaginal ultrasound examination of the pelvis was performed
including evaluation of the uterus, ovaries, adnexal regions, and
pelvic cul-de-sac.

[Series 1: us transvaginal non-ob · 37 acquisitions, 15 frames shown]
[im 1/37]
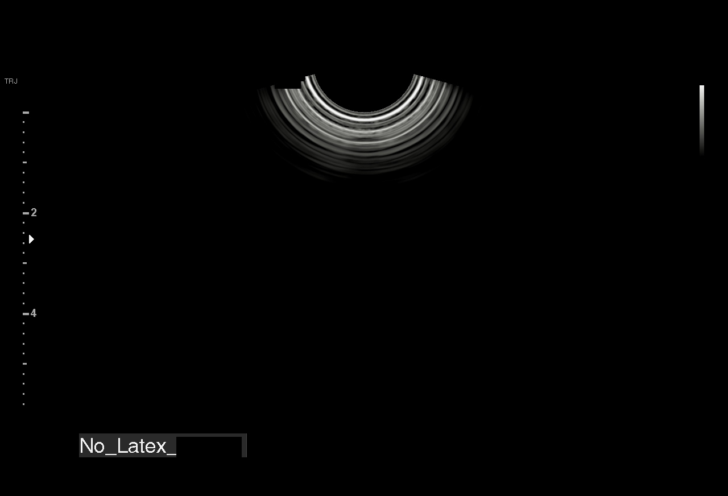
[im 4/37]
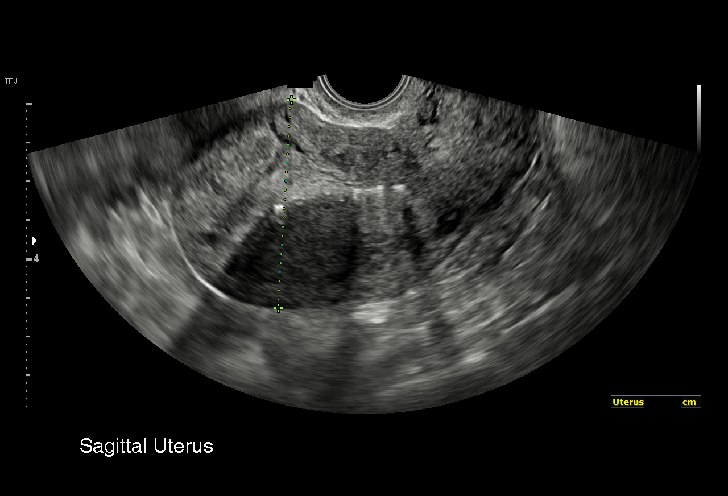
[im 7/37]
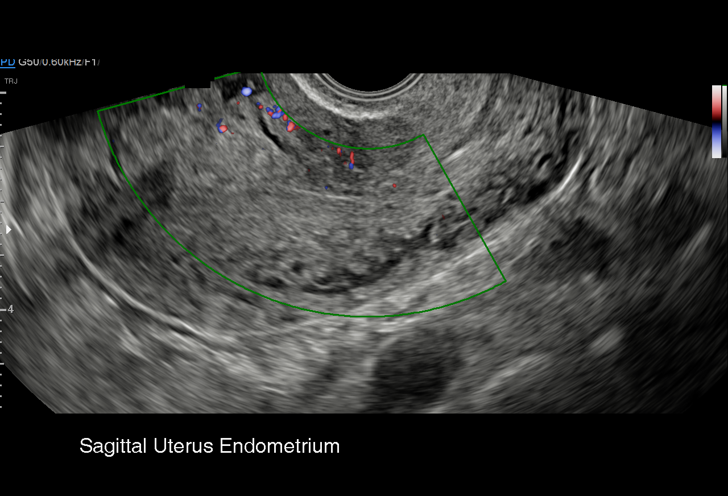
[im 8/37]
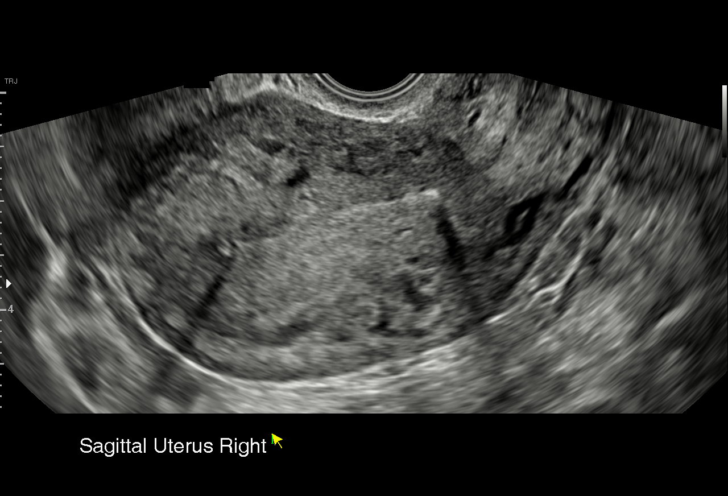
[im 11/37]
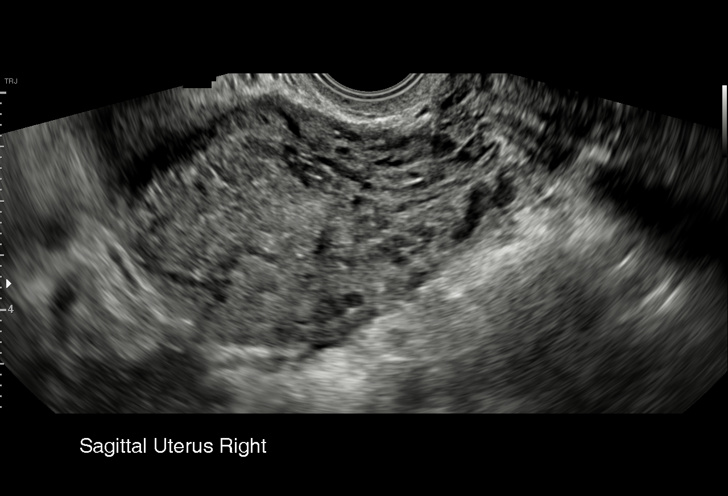
[im 14/37]
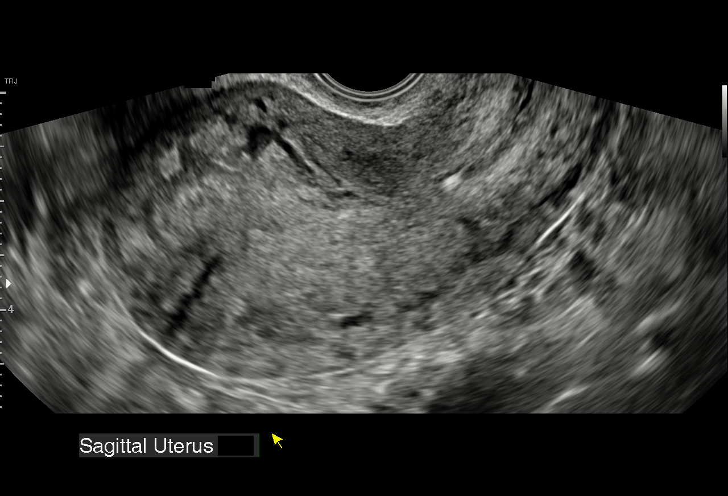
[im 16/37]
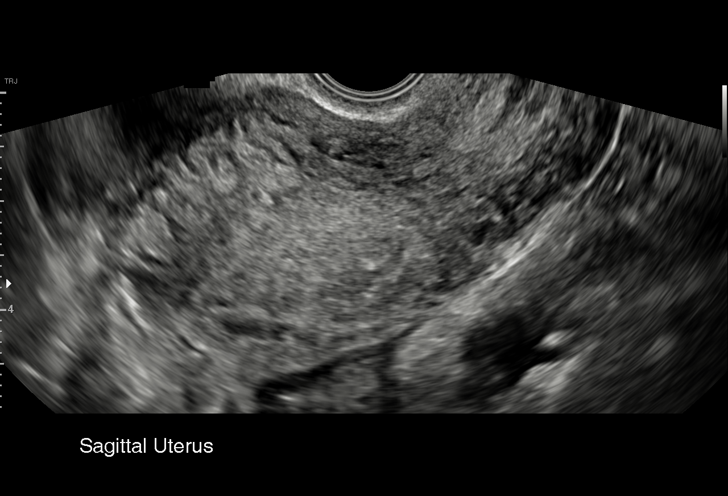
[im 19/37]
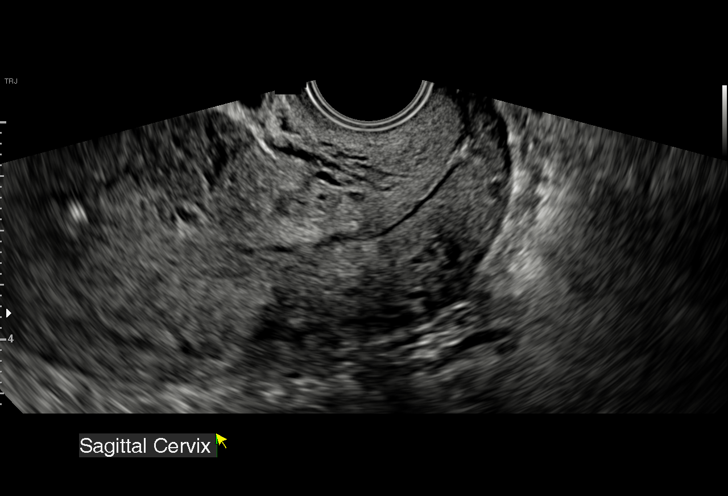
[im 22/37]
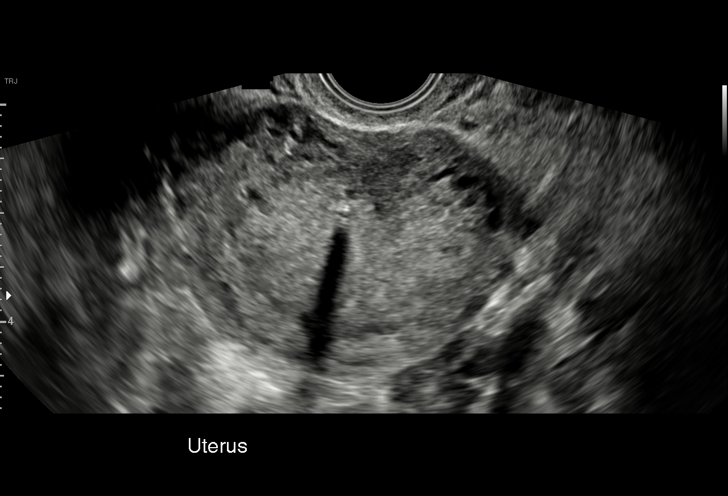
[im 23/37]
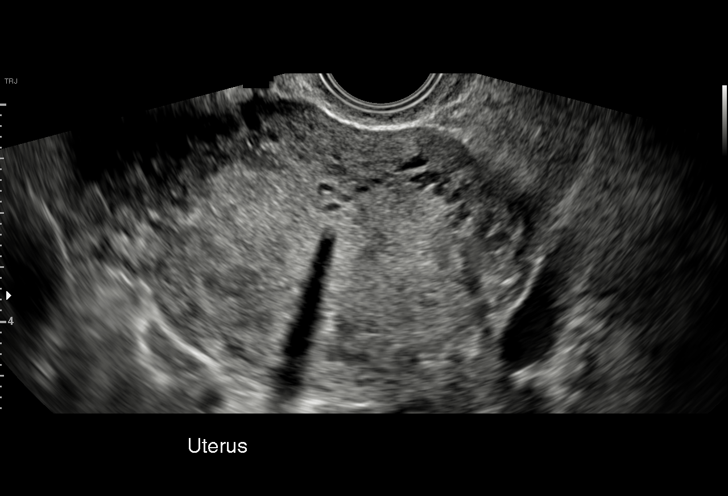
[im 26/37]
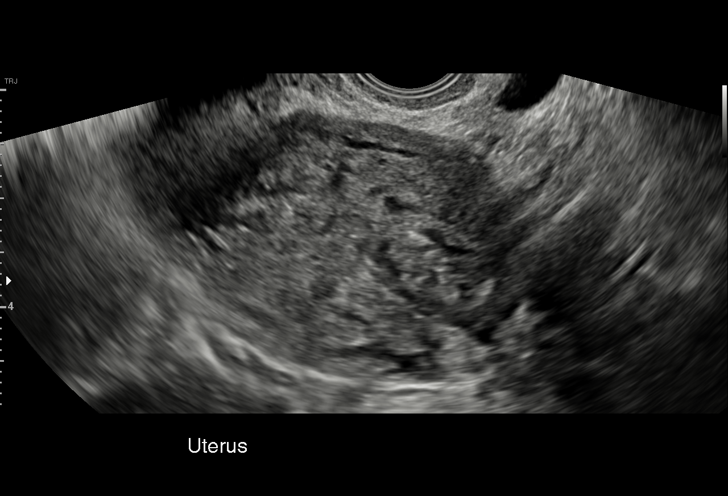
[im 29/37]
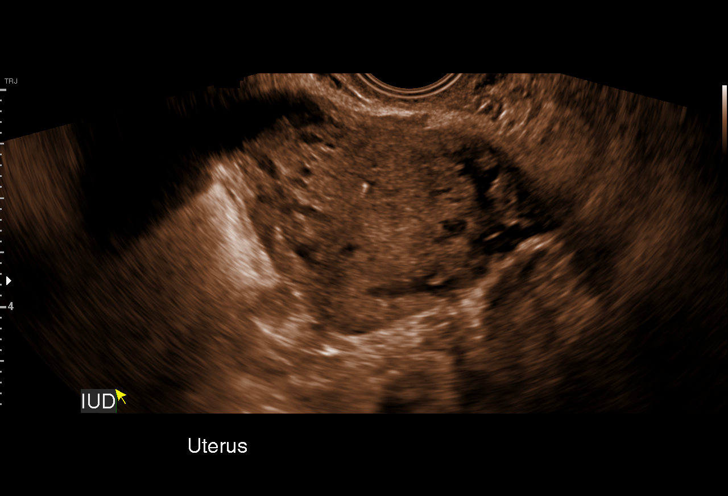
[im 31/37]
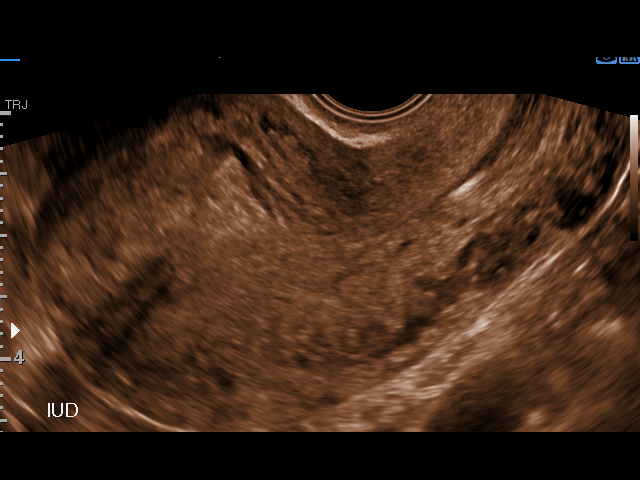
[im 34/37]
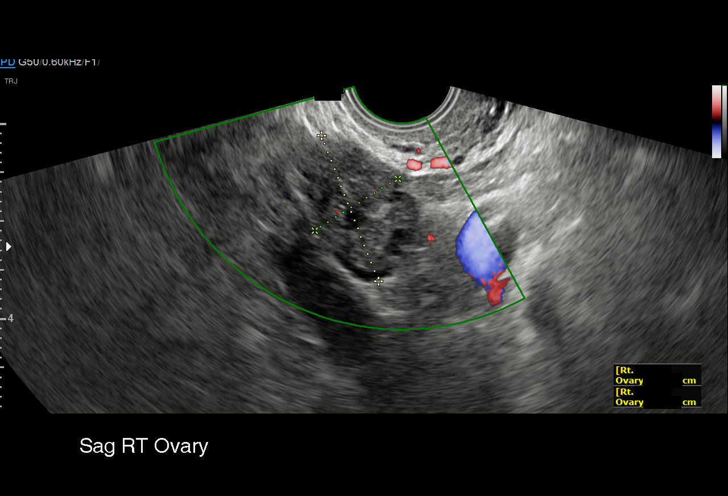
[im 37/37]
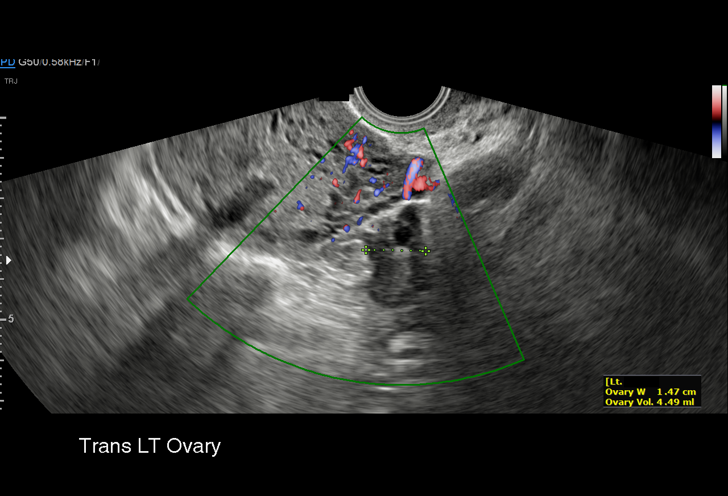

[15 of 25 positions shown; findings below may reference images not displayed]

FINDINGS: Uterus

Measurements: 9.6 x 5.4 x 6.3 cm = volume: 171 mL. Anteverted.
Mildly heterogeneous myometrial echogenicity without mass

Endometrium

Thickness: 5 mm. IUD within expected position in the upper uterine
endometrial canal. No endometrial fluid or focal mass

Right ovary

Measurements: 3.2 x 2.0 x 2.1 cm = volume: 7.2 mL. Normal morphology
without mass

Left ovary

Measurements: 3.3 x 1.8 x 1.5 cm = volume: 4.5 mL. Normal morphology
without mass

Other findings:  No free pelvic fluid.  No adnexal masses.
IMPRESSION: IUD within expected position at the upper uterine endometrial canal.

Otherwise normal exam.

## 2021-09-25 ENCOUNTER — Emergency Department (HOSPITAL_BASED_OUTPATIENT_CLINIC_OR_DEPARTMENT_OTHER)
Admission: EM | Admit: 2021-09-25 | Discharge: 2021-09-26 | Disposition: A | Discharge status: Home / Self Care | Payer: BC Managed Care – PPO | Attending: Emergency Medicine | Admitting: Emergency Medicine

## 2021-09-25 ENCOUNTER — Other Ambulatory Visit: Payer: Self-pay

## 2021-09-25 ENCOUNTER — Encounter (HOSPITAL_BASED_OUTPATIENT_CLINIC_OR_DEPARTMENT_OTHER): Payer: Self-pay | Admitting: Emergency Medicine

## 2021-09-25 DIAGNOSIS — I491 Atrial premature depolarization: Secondary | ICD-10-CM | POA: Insufficient documentation

## 2021-09-25 DIAGNOSIS — R0789 Other chest pain: Secondary | ICD-10-CM | POA: Diagnosis present

## 2021-09-25 DIAGNOSIS — Z87891 Personal history of nicotine dependence: Secondary | ICD-10-CM | POA: Diagnosis not present

## 2021-09-25 LAB — CBC WITH DIFFERENTIAL/PLATELET
Abs Immature Granulocytes: 0.01 10*3/uL (ref 0.00–0.07)
Basophils Absolute: 0 10*3/uL (ref 0.0–0.1)
Basophils Relative: 0 %
Eosinophils Absolute: 0.2 10*3/uL (ref 0.0–0.5)
Eosinophils Relative: 2 %
HCT: 40.6 % (ref 36.0–46.0)
Hemoglobin: 13.3 g/dL (ref 12.0–15.0)
Immature Granulocytes: 0 %
Lymphocytes Relative: 29 %
Lymphs Abs: 2 10*3/uL (ref 0.7–4.0)
MCH: 27 pg (ref 26.0–34.0)
MCHC: 32.8 g/dL (ref 30.0–36.0)
MCV: 82.4 fL (ref 80.0–100.0)
Monocytes Absolute: 0.5 10*3/uL (ref 0.1–1.0)
Monocytes Relative: 8 %
Neutro Abs: 4.2 10*3/uL (ref 1.7–7.7)
Neutrophils Relative %: 61 %
Platelets: 276 10*3/uL (ref 150–400)
RBC: 4.93 MIL/uL (ref 3.87–5.11)
RDW: 13.2 % (ref 11.5–15.5)
WBC: 6.8 10*3/uL (ref 4.0–10.5)
nRBC: 0 % (ref 0.0–0.2)

## 2021-09-25 NOTE — ED Triage Notes (Signed)
Chest pain and upper back pain with diaphoresis today. Pt went to UC and had abnormal EKG. Pt reports feeling like indigestion and belching today

## 2021-09-26 ENCOUNTER — Emergency Department (HOSPITAL_BASED_OUTPATIENT_CLINIC_OR_DEPARTMENT_OTHER): Payer: BC Managed Care – PPO

## 2021-09-26 DIAGNOSIS — R0789 Other chest pain: Secondary | ICD-10-CM | POA: Diagnosis not present

## 2021-09-26 LAB — COMPREHENSIVE METABOLIC PANEL
ALT: 14 U/L (ref 0–44)
AST: 19 U/L (ref 15–41)
Albumin: 3.7 g/dL (ref 3.5–5.0)
Alkaline Phosphatase: 52 U/L (ref 38–126)
Anion gap: 6 (ref 5–15)
BUN: 9 mg/dL (ref 6–20)
CO2: 25 mmol/L (ref 22–32)
Calcium: 9.1 mg/dL (ref 8.9–10.3)
Chloride: 107 mmol/L (ref 98–111)
Creatinine, Ser: 0.93 mg/dL (ref 0.44–1.00)
GFR, Estimated: >60 mL/min (ref >60)
Glucose, Bld: 98 mg/dL (ref 70–99)
Potassium: 3.4 mmol/L — ABNORMAL LOW (ref 3.5–5.1)
Sodium: 138 mmol/L (ref 135–145)
Total Bilirubin: 0.3 mg/dL (ref 0.3–1.2)
Total Protein: 7.1 g/dL (ref 6.5–8.1)

## 2021-09-26 LAB — TROPONIN I (HIGH SENSITIVITY)
Troponin I (High Sensitivity): 2 ng/L (ref <18)
Troponin I (High Sensitivity): <2 ng/L (ref <18)

## 2021-09-26 MED ORDER — OMEPRAZOLE 20 MG PO CPDR: 20.0000 mg | DELAYED_RELEASE_CAPSULE | Freq: Every day | ORAL | 0 refills | Status: AC

## 2021-09-26 MED ORDER — PANTOPRAZOLE SODIUM 40 MG IV SOLR
40.0000 mg | Freq: Once | INTRAVENOUS | Status: AC
  Administered 2021-09-26: 40 mg via INTRAVENOUS
  Filled 2021-09-26: qty 10

## 2021-09-26 NOTE — ED Provider Notes (Signed)
[facility] Provider Note: Lowella Dell, MD, FACEP  CSN: [id number] MRN: [id number] ARRIVAL: 09/25/21 at 2335 ROOM: MH02/MH02   CHIEF COMPLAINT  Chest Pain   HISTORY OF PRESENT ILLNESS  09/26/21 1:43 AM [name] is a 36 y.o. female who woke up yesterday morning with substernal chest discomfort.  She describes the chest discomfort as a tightness or heartburn-like sensation.  She also had an exacerbation of back pain that she had been having for several days.  She thinks the back pain is due to muscle spasms from stress.  She had excessive belching throughout the day yesterday which is not usual for her.  She was seen at an urgent care yesterday and had an abnormal EKG.  She did not bring the EKG with her but she states it showed some type of abnormal beats (PAC?).  Her symptoms are minimal at the present time.   Past Medical History:  Diagnosis Date   Vitamin D deficiency     Past Surgical History:  Procedure Laterality Date   LEG SURGERY     left ankle    Family History  Problem Relation Age of Onset   Hypertension Mother    Hyperlipidemia Mother    Hypertension Father    Hyperlipidemia Father    Diabetes Paternal Grandmother    Breast cancer Paternal Grandmother     Social History   Tobacco Use   Smoking status: Former    Packs/day: 0.00    Types: Cigarettes   Smokeless tobacco: Never  Vaping Use   Vaping Use: Some days  Substance Use Topics   Alcohol use: Yes    Alcohol/week: 0.0 standard drinks of alcohol    Comment: occasional   Drug use: No    Prior to Admission medications   Medication Sig Start Date End Date Taking? Authorizing Provider  omeprazole (PRILOSEC) 20 MG capsule Take 1 capsule (20 mg total) by mouth daily. 09/26/21  Yes Jelani Trueba, MD  acetaminophen (TYLENOL) 500 MG tablet Take 1,000 mg by mouth every 6 (six) hours as needed for moderate pain.    [provider]  ADDERALL XR 10 MG 24 hr capsule Take 10 mg by  mouth daily. 04/03/18   [provider]  levonorgestrel (MIRENA) 20 MCG/24HR IUD 1 each by Intrauterine route once.    [provider]  Multiple Vitamin (MULTIVITAMIN WITH MINERALS) TABS tablet Take 1 tablet by mouth daily.    [provider]  Vitamin D, Ergocalciferol, (DRISDOL) 1.25 MG (50000 UT) CAPS capsule Take 50,000 Units by mouth every 7 (seven) days.    [provider]    Allergies Patient has no known allergies.   REVIEW OF SYSTEMS  Negative except as noted here or in the History of Present Illness.   PHYSICAL EXAMINATION  Initial Vital Signs Blood pressure 117/79, pulse 74, temperature 98.2 F (36.8 C), temperature source Oral, resp. rate 16, height 5\' 7"  (1.702 m), weight 109.8 kg, SpO2 99 %.  Examination General: Well-developed, well-nourished female in no acute distress; appearance consistent with age of record HENT: normocephalic; atraumatic Eyes: Normal appearance Neck: supple Heart: regular rate and rhythm Lungs: clear to auscultation bilaterally Abdomen: soft; nondistended; nontender; bowel sounds present Extremities: No deformity; full range of motion; pulses normal Neurologic: Awake, alert and oriented; motor function intact in all extremities and symmetric; no facial droop Skin: Warm and dry Psychiatric: Normal mood and affect   RESULTS  Summary of this visit's results, reviewed and interpreted by  myself:   EKG Interpretation  Date/Time:  Friday September 25 2021 23:43:46 EDT Ventricular Rate:  67 PR Interval:  187 QRS Duration: 84 QT Interval:  379 QTC Calculation: 400 R Axis:   59 Text Interpretation: Sinus rhythm Normal ECG Rate is faster Confirmed by Idonna Heeren, Jonny Ruiz ([id number]) on 09/26/2021 1:44:08 AM       Laboratory Studies: Results for orders placed or performed during the hospital encounter of 09/25/21 (from the past 24 hour(s))  CBC with Differential     Status: None   Collection Time: 09/25/21 11:45 PM  Result  Value Ref Range   WBC 6.8 4.0 - 10.5 K/uL   RBC 4.93 3.87 - 5.11 MIL/uL   Hemoglobin 13.3 12.0 - 15.0 g/dL   HCT 41.6 60.6 - 30.1 %   MCV 82.4 80.0 - 100.0 fL   MCH 27.0 26.0 - 34.0 pg   MCHC 32.8 30.0 - 36.0 g/dL   RDW 60.1 09.3 - 23.5 %   Platelets 276 150 - 400 K/uL   nRBC 0.0 0.0 - 0.2 %   Neutrophils Relative % 61 %   Neutro Abs 4.2 1.7 - 7.7 K/uL   Lymphocytes Relative 29 %   Lymphs Abs 2.0 0.7 - 4.0 K/uL   Monocytes Relative 8 %   Monocytes Absolute 0.5 0.1 - 1.0 K/uL   Eosinophils Relative 2 %   Eosinophils Absolute 0.2 0.0 - 0.5 K/uL   Basophils Relative 0 %   Basophils Absolute 0.0 0.0 - 0.1 K/uL   Immature Granulocytes 0 %   Abs Immature Granulocytes 0.01 0.00 - 0.07 K/uL  Comprehensive metabolic panel     Status: Abnormal   Collection Time: 09/25/21 11:45 PM  Result Value Ref Range   Sodium 138 135 - 145 mmol/L   Potassium 3.4 (L) 3.5 - 5.1 mmol/L   Chloride 107 98 - 111 mmol/L   CO2 25 22 - 32 mmol/L   Glucose, Bld 98 70 - 99 mg/dL   BUN 9 6 - 20 mg/dL   Creatinine, Ser 5.73 0.44 - 1.00 mg/dL   Calcium 9.1 8.9 - 22.0 mg/dL   Total Protein 7.1 6.5 - 8.1 g/dL   Albumin 3.7 3.5 - 5.0 g/dL   AST 19 15 - 41 U/L   ALT 14 0 - 44 U/L   Alkaline Phosphatase 52 38 - 126 U/L   Total Bilirubin 0.3 0.3 - 1.2 mg/dL   GFR, Estimated >25 >42 mL/min   Anion gap 6 5 - 15  Troponin I (High Sensitivity)     Status: None   Collection Time: 09/25/21 11:45 PM  Result Value Ref Range   Troponin I (High Sensitivity) <2 <18 ng/L  Troponin I (High Sensitivity)     Status: None   Collection Time: 09/26/21  1:49 AM  Result Value Ref Range   Troponin I (High Sensitivity) 2 <18 ng/L   Imaging Studies: No results found.  ED COURSE and MDM  Nursing notes, initial and subsequent vitals signs, including pulse oximetry, reviewed and interpreted by myself.  Vitals:   09/25/21 2340 09/25/21 2347 09/26/21 0000 09/26/21 0100  BP:  131/77 130/75 117/79  Pulse:  89 73 74  Resp:  18 16  16   Temp:  98.2 F (36.8 C)    TempSrc:  Oral    SpO2:  100% 100% 99%  Weight: 109.8 kg     Height: 5\' 7"  (1.702 m)      Medications  pantoprazole (PROTONIX) injection 40 mg (  40 mg Intravenous Given 09/26/21 0200)    Rhythm strip reviewed.  Patient has had infrequent PACs.  No PVCs or sustained arrhythmia seen.   2:23 AM Patient's troponins are both at the low end of normal.  Her EKG is normal.  She has had some infrequent PACs and we will refer to cardiology for this.  I suspect her chest discomfort is due to GERD.  She was given a dose of Protonix IV in the ED.  We will treat her with a brief course of oral PPI.  The PACs may be due to her recently increased dose of Adderall (was on 10 mg, increased to 20 mg).  PROCEDURES  Procedures   ED DIAGNOSES     ICD-10-CM   1. Atypical chest pain  R07.89     2. Premature atrial contractions  I49.1 Ambulatory referral to Cardiology         Shanon Rosser, MD 09/26/21 [phone]

## 2021-12-01 ENCOUNTER — Ambulatory Visit: Payer: PRIVATE HEALTH INSURANCE | Admitting: Cardiology

## 2022-05-03 ENCOUNTER — Encounter (HOSPITAL_BASED_OUTPATIENT_CLINIC_OR_DEPARTMENT_OTHER): Payer: Self-pay | Admitting: Urology

## 2022-05-03 ENCOUNTER — Emergency Department (HOSPITAL_BASED_OUTPATIENT_CLINIC_OR_DEPARTMENT_OTHER)
Admission: EM | Admit: 2022-05-03 | Discharge: 2022-05-03 | Disposition: A | Discharge status: Home / Self Care | Payer: PRIVATE HEALTH INSURANCE | Attending: Emergency Medicine | Admitting: Emergency Medicine

## 2022-05-03 ENCOUNTER — Other Ambulatory Visit: Payer: Self-pay

## 2022-05-03 DIAGNOSIS — M25562 Pain in left knee: Secondary | ICD-10-CM | POA: Diagnosis present

## 2022-05-03 MED ORDER — KETOROLAC TROMETHAMINE 60 MG/2ML IM SOLN
60.0000 mg | Freq: Once | INTRAMUSCULAR | Status: AC
  Administered 2022-05-03: 60 mg via INTRAMUSCULAR
  Filled 2022-05-03: qty 2

## 2022-05-03 NOTE — Discharge Instructions (Signed)
Your symptoms could be due to a meniscal injury or ligamentous injury within the knee.  Recommend you follow-up outpatient with orthopedics.  A knee immobilizer and crutches have been provided for comfort and to assist with ambulation.  Recommend rest, ice, elevation of the extremity, NSAIDs for pain control.

## 2022-05-03 NOTE — ED Provider Notes (Signed)
[name] Provider Note   CSN: [id number] Arrival date & time: 05/03/22  1005     History  Chief Complaint  Patient presents with   Knee Pain    [name] is a 37 y.o. female.   Knee Pain    37 year old female presenting to the emergency department with left knee pain for the past 2 and half weeks.  The patient states that it all started when she was running on a treadmill.  She felt a popping, clicking and locking up sensation in her knee.  She has been overcompensating and now having pain in her right knee.  She endorses swelling that started yesterday.  She was started in urgent care yesterday and referred to orthopedics after negative x-ray imaging. No redness, no fevers or chills.  Home Medications Prior to Admission medications   Medication Sig Start Date End Date Taking? Authorizing Provider  acetaminophen (TYLENOL) 500 MG tablet Take 1,000 mg by mouth every 6 (six) hours as needed for moderate pain.    [provider]  ADDERALL XR 10 MG 24 hr capsule Take 10 mg by mouth daily. 04/03/18   [provider]  levonorgestrel (MIRENA) 20 MCG/24HR IUD 1 each by Intrauterine route once.    [provider]  Multiple Vitamin (MULTIVITAMIN WITH MINERALS) TABS tablet Take 1 tablet by mouth daily.    [provider]  omeprazole (PRILOSEC) 20 MG capsule Take 1 capsule (20 mg total) by mouth daily. 09/26/21   Molpus, John, MD  Vitamin D, Ergocalciferol, (DRISDOL) 1.25 MG (50000 UT) CAPS capsule Take 50,000 Units by mouth every 7 (seven) days.    [provider]      Allergies    Patient has no known allergies.    Review of Systems   Review of Systems  All other systems reviewed and are negative.   Physical Exam Updated Vital Signs BP 126/74 (BP Location: Left Arm)   Pulse (!) 59   Temp 98.1 F (36.7 C) (Oral)   Resp 19   Ht '5\' 7"'$  (1.702 m)   Wt 113.4 kg   SpO2 100%   BMI 39.16  kg/m  Physical Exam Vitals and nursing note reviewed.  Constitutional:      General: She is not in acute distress.    Appearance: She is well-developed.  HENT:     Head: Normocephalic and atraumatic.  Eyes:     Conjunctiva/sclera: Conjunctivae normal.  Cardiovascular:     Rate and Rhythm: Normal rate and regular rhythm.     Heart sounds: No murmur heard. Pulmonary:     Effort: Pulmonary effort is normal. No respiratory distress.     Breath sounds: Normal breath sounds.  Abdominal:     Palpations: Abdomen is soft.     Tenderness: There is no abdominal tenderness.  Musculoskeletal:        General: No swelling.     Cervical back: Neck supple.     Comments: Bilateral tenderness to palpation of the left knee with some focus of tenderness along the left lateral knee.  Intact range of motion passively.  Neurovascular intact distally.  No palpable joint effusion.  Pain on valgus stress test.  Skin:    General: Skin is warm and dry.     Capillary Refill: Capillary refill takes less than 2 seconds.  Neurological:     Mental Status: She is alert.  Psychiatric:        Mood and  Affect: Mood normal.     ED Results / Procedures / Treatments   Labs (all labs ordered are listed, but only abnormal results are displayed) Labs Reviewed - No data to display  EKG None  Radiology No results found.  Procedures Procedures    Medications Ordered in ED Medications  ketorolac (TORADOL) injection 60 mg (60 mg Intramuscular Given 05/03/22 1144)    ED Course/ Medical Decision Making/ A&P                             Medical Decision Making Risk Prescription drug management.    37 year old female presenting to the emergency department with left knee pain for the past 2 and half weeks.  The patient states that it all started when she was running on a treadmill.  She felt a popping, clicking and locking up sensation in her knee.  She has been overcompensating and now having pain in her  right knee.  She endorses swelling that started yesterday.  She was started in urgent care yesterday and referred to orthopedics after negative x-ray imaging. No redness, no fevers or chills.  On arrival, the patient was vitally stable.  Presenting with knee pain that is subacute for the last 2 and half weeks after sustaining an injury while exerting herself.  Physical exam significant for left lower extremity that is neurovascularly intact, tenderness on valgus stress test, no palpable joint effusion, intact range of motion passively.  Concern for meniscal injury versus ligamentous tear.  X-ray imaging from 05/02/2022 reviewed: CONCLUSION:  1. No acute fracture or malalignment. 2. Joint spaces are maintained. 3. Mild lateral patellar tilt, possibly technical artifact. 4. Small knee effusion.   Think repeat imaging is warranted at this time.  I have no concern for septic arthritis or gout flare.  Symptoms likely due to injury sustained during exertion.  Patient has not yet followed up outpatient with sports medicine orthopedics.  Ambulatory referral was placed.  The patient was placed in a knee immobilizer for comfort, advised rest, ice, elevation of the extremity, NSAIDs for pain control.  Crutches were provided as well.  Stable for outpatient management.  Final Clinical Impression(s) / ED Diagnoses Final diagnoses:  Acute pain of left knee    Rx / DC Orders ED Discharge Orders          Ordered    Ambulatory referral to Orthodontics        05/03/22 1153              Regan Lemming, MD 05/03/22 1154

## 2022-05-03 NOTE — ED Triage Notes (Signed)
Pt states left knee swelling and pain x 2.5 weeks Reports "popping, clicking, and locking up"  Reports started after running on treadmill   Xr at [facility] yesterday   H/o left ankle sx with hardware
